# Patient Record
Sex: Male | Born: 1960 | Race: White | Hispanic: No | Marital: Married | Smoking: Never smoker
Health system: Southern US, Community
[De-identification: ages and names within clinical notes are randomized; demographics above are authoritative.]

## PROBLEM LIST (undated history)

## (undated) DIAGNOSIS — E119 Type 2 diabetes mellitus without complications: Secondary | ICD-10-CM

## (undated) DIAGNOSIS — J189 Pneumonia, unspecified organism: Secondary | ICD-10-CM

## (undated) DIAGNOSIS — T7840XA Allergy, unspecified, initial encounter: Secondary | ICD-10-CM

## (undated) DIAGNOSIS — I4891 Unspecified atrial fibrillation: Secondary | ICD-10-CM

## (undated) DIAGNOSIS — M5126 Other intervertebral disc displacement, lumbar region: Secondary | ICD-10-CM

## (undated) DIAGNOSIS — G4733 Obstructive sleep apnea (adult) (pediatric): Secondary | ICD-10-CM

## (undated) DIAGNOSIS — J45909 Unspecified asthma, uncomplicated: Secondary | ICD-10-CM

## (undated) DIAGNOSIS — M51369 Other intervertebral disc degeneration, lumbar region without mention of lumbar back pain or lower extremity pain: Secondary | ICD-10-CM

## (undated) DIAGNOSIS — M1990 Unspecified osteoarthritis, unspecified site: Secondary | ICD-10-CM

## (undated) DIAGNOSIS — G709 Myoneural disorder, unspecified: Secondary | ICD-10-CM

## (undated) DIAGNOSIS — G4730 Sleep apnea, unspecified: Secondary | ICD-10-CM

## (undated) DIAGNOSIS — M5136 Other intervertebral disc degeneration, lumbar region: Secondary | ICD-10-CM

## (undated) DIAGNOSIS — K219 Gastro-esophageal reflux disease without esophagitis: Secondary | ICD-10-CM

## (undated) DIAGNOSIS — I10 Essential (primary) hypertension: Secondary | ICD-10-CM

## (undated) HISTORY — DX: Gastro-esophageal reflux disease without esophagitis: K21.9

## (undated) HISTORY — DX: Allergy, unspecified, initial encounter: T78.40XA

## (undated) HISTORY — DX: Other intervertebral disc degeneration, lumbar region without mention of lumbar back pain or lower extremity pain: M51.369

## (undated) HISTORY — PX: KNEE ARTHROPLASTY: SHX992

## (undated) HISTORY — DX: Other intervertebral disc degeneration, lumbar region: M51.36

## (undated) HISTORY — DX: Sleep apnea, unspecified: G47.30

## (undated) HISTORY — DX: Obstructive sleep apnea (adult) (pediatric): G47.33

## (undated) HISTORY — DX: Unspecified atrial fibrillation: I48.91

## (undated) HISTORY — DX: Other intervertebral disc displacement, lumbar region: M51.26

## (undated) HISTORY — DX: Myoneural disorder, unspecified: G70.9

## (undated) HISTORY — PX: NASAL SEPTUM SURGERY: SHX37

## (undated) HISTORY — PX: VASECTOMY: SHX75

## (undated) HISTORY — DX: Unspecified asthma, uncomplicated: J45.909

---

## 1998-01-24 ENCOUNTER — Ambulatory Visit (HOSPITAL_COMMUNITY): Admission: RE | Admit: 1998-01-24 | Discharge: 1998-01-24 | Payer: Self-pay | Admitting: Otolaryngology

## 1998-06-04 ENCOUNTER — Ambulatory Visit (HOSPITAL_BASED_OUTPATIENT_CLINIC_OR_DEPARTMENT_OTHER): Admission: RE | Admit: 1998-06-04 | Discharge: 1998-06-04 | Payer: Self-pay | Admitting: Otolaryngology

## 2002-06-26 ENCOUNTER — Encounter: Payer: Self-pay | Admitting: Orthopedic Surgery

## 2002-06-26 ENCOUNTER — Ambulatory Visit (HOSPITAL_COMMUNITY): Admission: RE | Admit: 2002-06-26 | Discharge: 2002-06-26 | Payer: Self-pay | Admitting: Orthopedic Surgery

## 2004-01-04 ENCOUNTER — Ambulatory Visit (HOSPITAL_COMMUNITY): Admission: RE | Admit: 2004-01-04 | Discharge: 2004-01-04 | Payer: Self-pay | Admitting: Urology

## 2004-07-28 ENCOUNTER — Emergency Department (HOSPITAL_COMMUNITY): Admission: EM | Admit: 2004-07-28 | Discharge: 2004-07-28 | Payer: Self-pay | Admitting: Emergency Medicine

## 2012-06-10 ENCOUNTER — Ambulatory Visit (INDEPENDENT_AMBULATORY_CARE_PROVIDER_SITE_OTHER): Payer: BC Managed Care – PPO | Admitting: Sports Medicine

## 2012-06-10 ENCOUNTER — Encounter: Payer: Self-pay | Admitting: Sports Medicine

## 2012-06-10 VITALS — BP 124/71 | HR 66 | Wt 207.0 lb

## 2012-06-10 DIAGNOSIS — M79609 Pain in unspecified limb: Secondary | ICD-10-CM

## 2012-06-10 DIAGNOSIS — I480 Paroxysmal atrial fibrillation: Secondary | ICD-10-CM | POA: Insufficient documentation

## 2012-06-10 DIAGNOSIS — I4891 Unspecified atrial fibrillation: Secondary | ICD-10-CM

## 2012-06-10 DIAGNOSIS — M79671 Pain in right foot: Secondary | ICD-10-CM

## 2012-06-10 DIAGNOSIS — Z299 Encounter for prophylactic measures, unspecified: Secondary | ICD-10-CM

## 2012-06-10 DIAGNOSIS — Z0000 Encounter for general adult medical examination without abnormal findings: Secondary | ICD-10-CM

## 2012-06-10 DIAGNOSIS — M79672 Pain in left foot: Secondary | ICD-10-CM

## 2012-06-10 DIAGNOSIS — Q6650 Congenital pes planus, unspecified foot: Secondary | ICD-10-CM | POA: Insufficient documentation

## 2012-06-10 MED ORDER — ASPIRIN EC 81 MG PO TBEC: 81.0000 mg | DELAYED_RELEASE_TABLET | Freq: Every day | ORAL | Status: DC

## 2012-06-10 NOTE — Assessment & Plan Note (Signed)
CPE done today. Fasting bloodwork ordered.

## 2012-06-10 NOTE — Assessment & Plan Note (Signed)
Most likely related to gout as he has had flares on the left and right at the first metatarsophalangeal joint. Intra-articular injections have helped, however he does not carry a gout diagnoses. I would like him to come back for custom orthotics. I'm also going to add a uric acid level.

## 2012-06-10 NOTE — Progress Notes (Signed)
Subjective:    CC: Establish care.   HPI:  Foot pain: Bilateral, wanted off, flares always come into the first metatarsophalangeal joint. He has never had uric acid levels tested, and has never had an arthrocentesis. His last flare he went to [employer], and had an intra-articular injection. This resolved his symptoms. He does note that the symptoms are somewhat worse when eating lots of red meat. Pain is localized, doesn't radiate, is severe when he gets it. He would also like some custom orthotics.  Atrial fibrillation:  Occurred in the past after being on phentermine, but 3 months after stopping, recurrent. Has been on beta blockers, but is not on aspirin.  Past medical history, Surgical history, Family history, Social history, Allergies, and medications have been entered into the medical record, reviewed, and no changes needed.   Review of Systems: No headache, visual changes, nausea, vomiting, diarrhea, constipation, dizziness, abdominal pain, skin rash, fevers, chills, night sweats, swollen lymph nodes, weight loss, chest pain, body aches, joint swelling, muscle aches, shortness of breath, mood changes, visual or auditory hallucinations.  Objective:    General: Well Developed, well nourished, and in no acute distress.  Neuro: Alert and oriented x3, extra-ocular muscles intact.  HEENT: Normocephalic, atraumatic, pupils equal round reactive to light, neck supple, no masses, no lymphadenopathy, thyroid nonpalpable.  Skin: Warm and dry, no rashes noted.  Cardiac: Regular rate and rhythm, no murmurs rubs or gallops.  Respiratory: Clear to auscultation bilaterally. Not using accessory muscles, speaking in full sentences.  Abdominal: Soft, nontender, nondistended, positive bowel sounds, no masses, no organomegaly.  Musculoskeletal: Shoulder, elbow, wrist, hip, knee, ankle stable, and with full range of motion. Rectal exam: Negative, prostate smooth, tone normal.  Hemoccult  negative.  Impression and Recommendations:    The patient was counselled, risk factors were discussed, anticipatory guidance given.

## 2012-06-10 NOTE — Assessment & Plan Note (Signed)
With a low CHADS2 score, paroxysmal and related to being on phentermine. He only needs aspirin.

## 2012-06-12 ENCOUNTER — Encounter: Payer: Self-pay | Admitting: Sports Medicine

## 2012-06-12 DIAGNOSIS — E785 Hyperlipidemia, unspecified: Secondary | ICD-10-CM | POA: Insufficient documentation

## 2012-06-12 DIAGNOSIS — E291 Testicular hypofunction: Secondary | ICD-10-CM | POA: Insufficient documentation

## 2012-06-12 DIAGNOSIS — M109 Gout, unspecified: Secondary | ICD-10-CM | POA: Insufficient documentation

## 2012-06-12 LAB — LIPID PANEL
Cholesterol: 195 mg/dL (ref 0–200)
HDL: 41 mg/dL (ref >39)
LDL Cholesterol: 132 mg/dL — ABNORMAL HIGH (ref 0–99)
Total CHOL/HDL Ratio: 4.8 Ratio
Triglycerides: 108 mg/dL (ref <150)
VLDL: 22 mg/dL (ref 0–40)

## 2012-06-12 LAB — CBC
HCT: 41.8 % (ref 39.0–52.0)
Hemoglobin: 14.9 g/dL (ref 13.0–17.0)
MCH: 32 pg (ref 26.0–34.0)
MCHC: 35.6 g/dL (ref 30.0–36.0)
MCV: 89.9 fL (ref 78.0–100.0)
Platelets: 148 10*3/uL — ABNORMAL LOW (ref 150–400)
RBC: 4.65 MIL/uL (ref 4.22–5.81)
RDW: 13.2 % (ref 11.5–15.5)
WBC: 6.1 10*3/uL (ref 4.0–10.5)

## 2012-06-12 LAB — TSH: TSH: 2.191 u[IU]/mL (ref 0.350–4.500)

## 2012-06-12 LAB — COMPREHENSIVE METABOLIC PANEL
ALT: 26 U/L (ref 0–53)
AST: 23 U/L (ref 0–37)
Albumin: 4.3 g/dL (ref 3.5–5.2)
Alkaline Phosphatase: 44 U/L (ref 39–117)
BUN: 20 mg/dL (ref 6–23)
CO2: 31 mEq/L (ref 19–32)
Calcium: 9.2 mg/dL (ref 8.4–10.5)
Chloride: 99 mEq/L (ref 96–112)
Creat: 1.05 mg/dL (ref 0.50–1.35)
Glucose, Bld: 83 mg/dL (ref 70–99)
Potassium: 4 mEq/L (ref 3.5–5.3)
Sodium: 137 mEq/L (ref 135–145)
Total Bilirubin: 1.5 mg/dL — ABNORMAL HIGH (ref 0.3–1.2)
Total Protein: 6.6 g/dL (ref 6.0–8.3)

## 2012-06-12 LAB — URIC ACID: Uric Acid, Serum: 8.2 mg/dL — ABNORMAL HIGH (ref 4.0–7.8)

## 2012-06-12 LAB — VITAMIN D 25 HYDROXY (VIT D DEFICIENCY, FRACTURES): Vit D, 25-Hydroxy: 34 ng/mL (ref 30–89)

## 2012-06-14 LAB — TESTOSTERONE, FREE, TOTAL, SHBG
Sex Hormone Binding: 22 nmol/L (ref 13–71)
Testosterone, Free: 55.1 pg/mL (ref 47.0–244.0)
Testosterone-% Free: 2.4 % (ref 1.6–2.9)
Testosterone: 231.06 ng/dL — ABNORMAL LOW (ref 300–890)

## 2012-06-16 ENCOUNTER — Encounter: Payer: BC Managed Care – PPO | Admitting: Sports Medicine

## 2012-06-28 ENCOUNTER — Encounter: Payer: BC Managed Care – PPO | Admitting: Sports Medicine

## 2012-07-08 ENCOUNTER — Ambulatory Visit (INDEPENDENT_AMBULATORY_CARE_PROVIDER_SITE_OTHER): Payer: BC Managed Care – PPO | Admitting: Sports Medicine

## 2012-07-08 DIAGNOSIS — M79672 Pain in left foot: Secondary | ICD-10-CM

## 2012-07-08 DIAGNOSIS — M79671 Pain in right foot: Secondary | ICD-10-CM

## 2012-07-08 DIAGNOSIS — M109 Gout, unspecified: Secondary | ICD-10-CM

## 2012-07-08 DIAGNOSIS — R269 Unspecified abnormalities of gait and mobility: Secondary | ICD-10-CM

## 2012-07-08 DIAGNOSIS — M79609 Pain in unspecified limb: Secondary | ICD-10-CM

## 2012-07-08 MED ORDER — ALLOPURINOL 300 MG PO TABS: ORAL_TABLET | ORAL | Status: DC

## 2012-07-08 NOTE — Assessment & Plan Note (Signed)
Custom orthotics as above. Return in 4 weeks to recheck.

## 2012-07-08 NOTE — Progress Notes (Signed)
Subjective:    CC: Followup and orthotics.  HPI: Elevated uric acid: Has had multiple flares of podagra as well as knee pain. He's had intra-articular injections, but has never been checked for uric acid levels or gout. I recently checked this, and uric acid levels were very elevated. Currently he is not having a flare.  Foot pain: With bilateral pes planus, and abnormality of gait, with pain localized on both arches, mild, without radiation, is here for custom orthotics.  Past medical history, Surgical history, Family history not pertinant except as noted below, Social history, Allergies, and medications have been entered into the medical record, reviewed, and no changes needed.   Review of Systems: No fevers, chills, night sweats, weight loss, chest pain, or shortness of breath.   Objective:    General: Well Developed, well nourished, and in no acute distress.  Neuro: Alert and oriented x3, extra-ocular muscles intact, sensation grossly intact.  HEENT: Normocephalic, atraumatic, pupils equal round reactive to light, neck supple, no masses, no lymphadenopathy, thyroid nonpalpable.  Skin: Warm and dry, no rashes. Cardiac: Regular rate and rhythm, no murmurs rubs or gallops.  Respiratory: Clear to auscultation bilaterally. Not using accessory muscles, speaking in full sentences.  Patient was fitted for a : standard, cushioned, semi-rigid orthotic. The orthotic was heated and afterward the patient stood on the orthotic blank positioned on the orthotic stand. The patient was positioned in subtalar neutral position and 10 degrees of ankle dorsiflexion in a weight bearing stance. After completion of molding, a stable base was applied to the orthotic blank. The blank was ground to a stable position for weight bearing. Size: 12 Base: Blue EVA Additional Posting and Padding: None The patient ambulated these, and they were very comfortable.  Impression and Recommendations:

## 2012-07-08 NOTE — Assessment & Plan Note (Signed)
Uric acid was high. Allopurinol, recheck in one month.

## 2012-07-24 ENCOUNTER — Other Ambulatory Visit: Payer: Self-pay

## 2012-08-05 ENCOUNTER — Ambulatory Visit (INDEPENDENT_AMBULATORY_CARE_PROVIDER_SITE_OTHER): Payer: BC Managed Care – PPO | Admitting: Sports Medicine

## 2012-08-05 VITALS — BP 124/77 | HR 60 | Wt 203.0 lb

## 2012-08-05 DIAGNOSIS — E291 Testicular hypofunction: Secondary | ICD-10-CM

## 2012-08-05 DIAGNOSIS — M109 Gout, unspecified: Secondary | ICD-10-CM

## 2012-08-05 DIAGNOSIS — E785 Hyperlipidemia, unspecified: Secondary | ICD-10-CM

## 2012-08-05 DIAGNOSIS — Q6650 Congenital pes planus, unspecified foot: Secondary | ICD-10-CM

## 2012-08-05 NOTE — Assessment & Plan Note (Signed)
Rechecking uric acid levels on allopurinol. Goal is 5.

## 2012-08-05 NOTE — Progress Notes (Signed)
Subjective:    CC: Followup  HPI: Hyperlipidemia: Would like to work on diet and exercise.  Gout: No further flares, having no problems with allopurinol.  Bilateral pes planus: Doing very well with custom orthotics. Pain is 100% resolved.  Hypogonadism: Denies any symptoms of fatigue, decreased sex drive, erectile dysfunction, or difficulty gaining the muscle mass.  Past medical history, Surgical history, Family history not pertinant except as noted below, Social history, Allergies, and medications have been entered into the medical record, reviewed, and no changes needed.   Review of Systems: No fevers, chills, night sweats, weight loss, chest pain, or shortness of breath.   Objective:    General: Well Developed, well nourished, and in no acute distress.  Neuro: Alert and oriented x3, extra-ocular muscles intact, sensation grossly intact.  HEENT: Normocephalic, atraumatic, pupils equal round reactive to light, neck supple, no masses, no lymphadenopathy, thyroid nonpalpable.  Skin: Warm and dry, no rashes. Cardiac: Regular rate and rhythm, no murmurs rubs or gallops.  Respiratory: Clear to auscultation bilaterally. Not using accessory muscles, speaking in full sentences. Impression and Recommendations:

## 2012-08-05 NOTE — Assessment & Plan Note (Signed)
Resolved with custom orthotics 

## 2012-08-05 NOTE — Assessment & Plan Note (Signed)
We will do a trial of diet and exercise and lifestyle modification. If no better in 6-12 weeks we may need to add Lipitor.

## 2012-08-05 NOTE — Patient Instructions (Addendum)

## 2012-08-05 NOTE — Assessment & Plan Note (Signed)
No symptoms, so will not treat.

## 2012-08-06 ENCOUNTER — Other Ambulatory Visit: Payer: Self-pay | Admitting: *Deleted

## 2012-08-06 LAB — URIC ACID: Uric Acid, Serum: 7.2 mg/dL (ref 4.0–7.8)

## 2012-08-06 MED ORDER — METOPROLOL SUCCINATE ER 25 MG PO TB24: 25.0000 mg | ORAL_TABLET | Freq: Every day | ORAL | Status: DC

## 2012-08-06 MED ORDER — ALLOPURINOL 300 MG PO TABS: 300.0000 mg | ORAL_TABLET | Freq: Two times a day (BID) | ORAL | Status: DC

## 2012-08-06 NOTE — Addendum Note (Signed)
Addended by: Monica Becton on: 08/06/2012 09:31 AM   Modules accepted: Orders

## 2012-09-30 ENCOUNTER — Ambulatory Visit: Payer: BC Managed Care – PPO | Admitting: Sports Medicine

## 2012-12-21 ENCOUNTER — Ambulatory Visit (INDEPENDENT_AMBULATORY_CARE_PROVIDER_SITE_OTHER): Payer: BC Managed Care – PPO | Admitting: Sports Medicine

## 2012-12-21 ENCOUNTER — Encounter: Payer: Self-pay | Admitting: Sports Medicine

## 2012-12-21 VITALS — BP 137/87 | HR 81 | Wt 214.0 lb

## 2012-12-21 DIAGNOSIS — M109 Gout, unspecified: Secondary | ICD-10-CM

## 2012-12-21 MED ORDER — ALLOPURINOL 300 MG PO TABS: 300.0000 mg | ORAL_TABLET | Freq: Two times a day (BID) | ORAL | Status: DC

## 2012-12-21 MED ORDER — DICLOFENAC SODIUM 75 MG PO TBEC: 75.0000 mg | DELAYED_RELEASE_TABLET | Freq: Two times a day (BID) | ORAL | Status: DC

## 2012-12-21 NOTE — Progress Notes (Signed)
  Subjective:    CC: Right great toe pain  HPI: This is a pleasant 52 year old male with a history of gout who comes in with a one-day history of pain and swelling in his right first metatarsophalangeal joint. Pain is localized, does not radiate , moderate to severe.  He is on allopurinol, most recent uric acid levels were in the sevens.  Past medical history, Surgical history, Family history not pertinant except as noted below, Social history, Allergies, and medications have been entered into the medical record, reviewed, and no changes needed.   Review of Systems: No fevers, chills, night sweats, weight loss, chest pain, or shortness of breath.   Objective:    General: Well Developed, well nourished, and in no acute distress.  Neuro: Alert and oriented x3, extra-ocular muscles intact, sensation grossly intact.  HEENT: Normocephalic, atraumatic, pupils equal round reactive to light, neck supple, no masses, no lymphadenopathy, thyroid nonpalpable.  Skin: Warm and dry, no rashes. Cardiac: Regular rate and rhythm, no murmurs rubs or gallops, no lower extremity edema.  Respiratory: Clear to auscultation bilaterally. Not using accessory muscles, speaking in full sentences. Right Foot: Fusiform swelling over the first metatarsophalangeal joint Range of motion is full in all directions. Strength is 5/5 in all directions. No hallux valgus. No pes cavus or pes planus. No abnormal callus noted. No pain over the navicular prominence, or base of fifth metatarsal. No tenderness to palpation of the calcaneal insertion of plantar fascia. No pain at the Achilles insertion. No pain over the calcaneal bursa. No pain of the retrocalcaneal bursa. No tenderness to palpation over the tarsals, metatarsals, or phalanges. No hallux rigidus or limitus. Moderately tender to palpation over the right first metatarsophalangeal joint. No pain with compression of the metatarsal heads. Neurovascularly intact  distally.  Procedure: Real-time Ultrasound Guided Injection of right first MTP Device: GE Logiq E  Verbal informed consent obtained.  Time-out conducted.  Noted no overlying erythema, induration, or other signs of local infection.  Skin prepped in a sterile fashion.  Local anesthesia: Topical Ethyl chloride.  With sterile technique and under real time ultrasound guidance:  25-gauge needle advanced in the joint, 0.5 cc Kenalog 40, 0.5 cc lidocaine injected easily into the joint. Completed without difficulty  Advised to call if fevers/chills, erythema, induration, drainage, or persistent bleeding.  Images permanently stored and available for review in the ultrasound unit.  Impression: Technically successful ultrasound guided injection.  Impression and Recommendations:

## 2012-12-21 NOTE — Assessment & Plan Note (Signed)
Right foot podagra. Guided injection as above. Rechecking uric acid levels in 3 weeks. Refilling Voltaren.

## 2013-01-12 LAB — URIC ACID: Uric Acid, Serum: 3.5 mg/dL — ABNORMAL LOW (ref 4.0–7.8)

## 2013-02-01 ENCOUNTER — Ambulatory Visit (INDEPENDENT_AMBULATORY_CARE_PROVIDER_SITE_OTHER): Payer: BC Managed Care – PPO | Admitting: Sports Medicine

## 2013-02-01 ENCOUNTER — Encounter: Payer: Self-pay | Admitting: Sports Medicine

## 2013-02-01 VITALS — BP 131/84 | HR 80 | Wt 211.0 lb

## 2013-02-01 DIAGNOSIS — M109 Gout, unspecified: Secondary | ICD-10-CM

## 2013-02-01 NOTE — Assessment & Plan Note (Signed)
Pain and first metatarsophalangeal joint has resolved after injection. Uric acid levels are nice and low. Return on an as-needed basis.

## 2013-02-01 NOTE — Progress Notes (Signed)
  Subjective:    CC: Followup  HPI: Podagra: Injected a month ago, pain-free. Uric acid levels were 3.  Past medical history, Surgical history, Family history not pertinant except as noted below, Social history, Allergies, and medications have been entered into the medical record, reviewed, and no changes needed.   Review of Systems: No fevers, chills, night sweats, weight loss, chest pain, or shortness of breath.   Objective:    General: Well Developed, well nourished, and in no acute distress.  Neuro: Alert and oriented x3, extra-ocular muscles intact, sensation grossly intact.  HEENT: Normocephalic, atraumatic, pupils equal round reactive to light, neck supple, no masses, no lymphadenopathy, thyroid nonpalpable.  Skin: Warm and dry, no rashes. Cardiac: Regular rate and rhythm, no murmurs rubs or gallops, no lower extremity edema.  Respiratory: Clear to auscultation bilaterally. Not using accessory muscles, speaking in full sentences. Left Foot: No visible erythema or swelling. Range of motion is full in all directions. Strength is 5/5 in all directions. No hallux valgus. No pes cavus or pes planus. No abnormal callus noted. No pain over the navicular prominence, or base of fifth metatarsal. No tenderness to palpation of the calcaneal insertion of plantar fascia. No pain at the Achilles insertion. No pain over the calcaneal bursa. No pain of the retrocalcaneal bursa. No tenderness to palpation over the tarsals, metatarsals, or phalanges. No hallux rigidus or limitus. No tenderness palpation over interphalangeal joints. No pain with compression of the metatarsal heads. Neurovascularly intact distally.  Impression and Recommendations:

## 2013-04-14 ENCOUNTER — Other Ambulatory Visit: Payer: Self-pay

## 2013-05-04 ENCOUNTER — Other Ambulatory Visit: Payer: Self-pay | Admitting: *Deleted

## 2013-05-04 DIAGNOSIS — M109 Gout, unspecified: Secondary | ICD-10-CM

## 2013-05-04 MED ORDER — ALLOPURINOL 300 MG PO TABS: 300.0000 mg | ORAL_TABLET | Freq: Two times a day (BID) | ORAL | Status: DC

## 2013-05-10 ENCOUNTER — Other Ambulatory Visit: Payer: Self-pay | Admitting: *Deleted

## 2013-05-10 ENCOUNTER — Other Ambulatory Visit: Payer: Self-pay | Admitting: Sports Medicine

## 2013-05-10 DIAGNOSIS — M109 Gout, unspecified: Secondary | ICD-10-CM

## 2013-05-10 MED ORDER — ALLOPURINOL 300 MG PO TABS: 300.0000 mg | ORAL_TABLET | Freq: Two times a day (BID) | ORAL | Status: DC

## 2013-05-16 ENCOUNTER — Encounter: Payer: Self-pay | Admitting: Sports Medicine

## 2013-05-16 ENCOUNTER — Ambulatory Visit (INDEPENDENT_AMBULATORY_CARE_PROVIDER_SITE_OTHER): Payer: BC Managed Care – PPO | Admitting: Sports Medicine

## 2013-05-16 VITALS — BP 124/83 | HR 65 | Wt 219.0 lb

## 2013-05-16 DIAGNOSIS — J0100 Acute maxillary sinusitis, unspecified: Secondary | ICD-10-CM

## 2013-05-16 MED ORDER — PREDNISONE 50 MG PO TABS: 50.0000 mg | ORAL_TABLET | Freq: Every day | ORAL | Status: DC

## 2013-05-16 MED ORDER — BENZONATATE 200 MG PO CAPS: 200.0000 mg | ORAL_CAPSULE | Freq: Three times a day (TID) | ORAL | Status: DC | PRN

## 2013-05-16 MED ORDER — AZITHROMYCIN 250 MG PO TABS: ORAL_TABLET | ORAL | Status: DC

## 2013-05-16 NOTE — Assessment & Plan Note (Signed)
With associated bronchitis and laryngitis. Azithromycin, prednisone, Tessalon Perles for cough, return as needed.

## 2013-05-16 NOTE — Progress Notes (Signed)
  Subjective:    CC: Sick  HPI: This pleasant 52 year old male comes in with sinus pain and pressure, cough that is nonproductive, no GI symptoms. He does have nasal discharge, no constitutional symptoms. Symptoms are moderate, persistent. He also has hoarse voice.  Past medical history, Surgical history, Family history not pertinant except as noted below, Social history, Allergies, and medications have been entered into the medical record, reviewed, and no changes needed.   Review of Systems: No fevers, chills, night sweats, weight loss, chest pain, or shortness of breath.   Objective:    General: Well Developed, well nourished, and in no acute distress.  Neuro: Alert and oriented x3, extra-ocular muscles intact, sensation grossly intact.  HEENT: Normocephalic, atraumatic, pupils equal round reactive to light, neck supple, no masses, no lymphadenopathy, thyroid nonpalpable. Tender to palpation over the maxillary sinuses, external ear canals, oropharynx, nasopharynx unremarkable. Skin: Warm and dry, no rashes. Cardiac: Regular rate and rhythm, no murmurs rubs or gallops, no lower extremity edema.  Respiratory: Clear to auscultation bilaterally. Not using accessory muscles, speaking in full sentences.  Impression and Recommendations:

## 2013-07-29 ENCOUNTER — Encounter: Payer: Self-pay | Admitting: Sports Medicine

## 2013-07-29 ENCOUNTER — Ambulatory Visit (INDEPENDENT_AMBULATORY_CARE_PROVIDER_SITE_OTHER): Payer: BC Managed Care – PPO | Admitting: Sports Medicine

## 2013-07-29 VITALS — BP 149/93 | HR 79 | Wt 220.0 lb

## 2013-07-29 DIAGNOSIS — M7710 Lateral epicondylitis, unspecified elbow: Secondary | ICD-10-CM

## 2013-07-29 DIAGNOSIS — M7712 Lateral epicondylitis, left elbow: Secondary | ICD-10-CM | POA: Insufficient documentation

## 2013-07-29 MED ORDER — HYDROCODONE-ACETAMINOPHEN 5-325 MG PO TABS: 1.0000 | ORAL_TABLET | Freq: Three times a day (TID) | ORAL | Status: DC | PRN

## 2013-07-29 NOTE — Progress Notes (Signed)
  Subjective:    CC: Left elbow pain  HPI: This pleasant 53 year old male comes in with a several week history of pain he localizes over the common extensor tendon origin over his left elbow, he has been using NSAIDs but unfortunately the pain is persistent. No radiation. Moderate, persistent  Past medical history, Surgical history, Family history not pertinant except as noted below, Social history, Allergies, and medications have been entered into the medical record, reviewed, and no changes needed.   Review of Systems: No fevers, chills, night sweats, weight loss, chest pain, or shortness of breath.   Objective:    General: Well Developed, well nourished, and in no acute distress.  Neuro: Alert and oriented x3, extra-ocular muscles intact, sensation grossly intact.  HEENT: Normocephalic, atraumatic, pupils equal round reactive to light, neck supple, no masses, no lymphadenopathy, thyroid nonpalpable.  Skin: Warm and dry, no rashes. Cardiac: Regular rate and rhythm, no murmurs rubs or gallops, no lower extremity edema.  Respiratory: Clear to auscultation bilaterally. Not using accessory muscles, speaking in full sentences. Left Elbow: Unremarkable to inspection. Range of motion full pronation, supination, flexion, extension. Strength is full to all of the above directions Stable to varus, valgus stress. Negative moving valgus stress test. Tender to palpation of the common extensor tendon origin with reproduction of pain with resisted extension of the middle finger. Negative cubital tunnel Tinel's.  Procedure: Real-time Ultrasound Guided Injection of left common extensor tendon origin Device: GE Logiq E  Verbal informed consent obtained.  Time-out conducted.  Noted no overlying erythema, induration, or other signs of local infection.  Skin prepped in a sterile fashion.  Local anesthesia: Topical Ethyl chloride.  With sterile technique and under real time ultrasound guidance:  I  performed a needle tenotomy injecting a total of 1 cc Kenalog 40, and 3 cc lidocaine just deep to and superficial to the common extensor tendon origin. Completed without difficulty  Pain immediately resolved suggesting accurate placement of the medication.  Advised to call if fevers/chills, erythema, induration, drainage, or persistent bleeding.  Images permanently stored and available for review in the ultrasound unit.  Impression: Technically successful ultrasound guided injection.  The elbow was then strapped with compressive dressing.  Impression and Recommendations:

## 2013-07-29 NOTE — Assessment & Plan Note (Signed)
Ultrasound guided injection/needle tenotomy. Vicodin for postprocedural pain. Strap with compressive dressing. Counter force brace, home rehabilitation. Limited use for the next week at his left arm. Return to see me in one month.

## 2013-08-29 ENCOUNTER — Ambulatory Visit: Payer: BC Managed Care – PPO | Admitting: Sports Medicine

## 2013-09-23 ENCOUNTER — Other Ambulatory Visit: Payer: Self-pay | Admitting: Sports Medicine

## 2013-10-13 ENCOUNTER — Ambulatory Visit (INDEPENDENT_AMBULATORY_CARE_PROVIDER_SITE_OTHER): Payer: BC Managed Care – PPO | Admitting: Sports Medicine

## 2013-10-13 ENCOUNTER — Encounter: Payer: Self-pay | Admitting: Sports Medicine

## 2013-10-13 VITALS — BP 123/82 | HR 72 | Ht 70.0 in | Wt 218.0 lb

## 2013-10-13 DIAGNOSIS — M7712 Lateral epicondylitis, left elbow: Secondary | ICD-10-CM

## 2013-10-13 DIAGNOSIS — M7710 Lateral epicondylitis, unspecified elbow: Secondary | ICD-10-CM

## 2013-10-13 MED ORDER — DICLOFENAC SODIUM 75 MG PO TBEC: 75.0000 mg | DELAYED_RELEASE_TABLET | Freq: Two times a day (BID) | ORAL | Status: DC

## 2013-10-13 MED ORDER — NITROGLYCERIN 0.2 MG/HR TD PT24: MEDICATED_PATCH | TRANSDERMAL | Status: DC

## 2013-10-13 NOTE — Assessment & Plan Note (Signed)
Overall improved significantly after ultrasound-guided needle tenotomy in February. He did have increasing pain after fishing, this is now improving. I think at this point he can see a physical therapist and continue home rehabilitation, also like to add topical nitroglycerin patches. Return in a month. MRI if no better.

## 2013-10-13 NOTE — Progress Notes (Signed)
  Subjective:    CC: Recheck elbow  HPI: This is a very pleasant 53 year old male, in February I did an ultrasound-guided percutaneous needle tenotomy of the left common extensor tendon. He had a fantastic response until he went fishing a month ago. As he tried to reel in a large fish, he felt a tearing sensation in his common extensor tendon origin. Since then it improved significantly, pain is only mild. He continues to do some of the rehabilitation exercises. He does need a refill on diclofenac.  Past medical history, Surgical history, Family history not pertinant except as noted below, Social history, Allergies, and medications have been entered into the medical record, reviewed, and no changes needed.   Review of Systems: No fevers, chills, night sweats, weight loss, chest pain, or shortness of breath.   Objective:    General: Well Developed, well nourished, and in no acute distress.  Neuro: Alert and oriented x3, extra-ocular muscles intact, sensation grossly intact.  HEENT: Normocephalic, atraumatic, pupils equal round reactive to light, neck supple, no masses, no lymphadenopathy, thyroid nonpalpable.  Skin: Warm and dry, no rashes. Cardiac: Regular rate and rhythm, no murmurs rubs or gallops, no lower extremity edema.  Respiratory: Clear to auscultation bilaterally. Not using accessory muscles, speaking in full sentences. Left Elbow: Unremarkable to inspection. Range of motion full pronation, supination, flexion, extension. Strength is full to all of the above directions, there is only mild reproduction of pain with resisted wrist extension, and middle finger extension. Stable to varus, valgus stress. Negative moving valgus stress test. No discrete areas of tenderness to palpation. Ulnar nerve does not sublux. Negative cubital tunnel Tinel's.  Impression and Recommendations:

## 2013-11-16 ENCOUNTER — Other Ambulatory Visit: Payer: Self-pay | Admitting: Sports Medicine

## 2013-12-20 ENCOUNTER — Other Ambulatory Visit: Payer: Self-pay | Admitting: Sports Medicine

## 2014-01-24 ENCOUNTER — Other Ambulatory Visit: Payer: Self-pay | Admitting: Sports Medicine

## 2014-02-24 ENCOUNTER — Ambulatory Visit: Payer: BC Managed Care – PPO | Admitting: Physician Assistant

## 2014-02-27 ENCOUNTER — Telehealth: Payer: Self-pay | Admitting: *Deleted

## 2014-02-27 ENCOUNTER — Ambulatory Visit (INDEPENDENT_AMBULATORY_CARE_PROVIDER_SITE_OTHER): Payer: BC Managed Care – PPO

## 2014-02-27 ENCOUNTER — Other Ambulatory Visit: Payer: Self-pay | Admitting: Sports Medicine

## 2014-02-27 ENCOUNTER — Encounter: Payer: Self-pay | Admitting: Sports Medicine

## 2014-02-27 ENCOUNTER — Ambulatory Visit (INDEPENDENT_AMBULATORY_CARE_PROVIDER_SITE_OTHER): Payer: BC Managed Care – PPO | Admitting: Sports Medicine

## 2014-02-27 VITALS — BP 120/76 | HR 78 | Ht 70.0 in | Wt 222.0 lb

## 2014-02-27 DIAGNOSIS — Z0189 Encounter for other specified special examinations: Secondary | ICD-10-CM

## 2014-02-27 DIAGNOSIS — M25469 Effusion, unspecified knee: Secondary | ICD-10-CM

## 2014-02-27 DIAGNOSIS — M25461 Effusion, right knee: Secondary | ICD-10-CM

## 2014-02-27 DIAGNOSIS — M23302 Other meniscus derangements, unspecified lateral meniscus, unspecified knee: Secondary | ICD-10-CM

## 2014-02-27 DIAGNOSIS — M1711 Unilateral primary osteoarthritis, right knee: Secondary | ICD-10-CM | POA: Insufficient documentation

## 2014-02-27 NOTE — Telephone Encounter (Signed)
No prior auth required. William Ortega, CMA 

## 2014-02-27 NOTE — Progress Notes (Deleted)

## 2014-02-27 NOTE — Assessment & Plan Note (Signed)
History of gout but with significant hemarthrosis on aspirate. Injected, fluid analysis. MRI. Return to go for MRI results.

## 2014-02-27 NOTE — Progress Notes (Signed)
Patient ID: William Ortega, male   DOB: 1961-04-23, 53 y.o.   MRN: [id number]  Subjective:    CC: Right knee pain and swelling  HPI: William Ortega is a very pleasant 53 year old man with history of gout who presents with 5 days of right knee pain and swelling. States that the discomfort began on Wednesday (9/16), but rapidly progressed to a severe pain with significant swelling on Friday (9/18). He describes the pain as stabbing and deep within the joint, worse with walking. Over the past three days, he has utilized a knee brace and diclofenac 75 mg bid. With use of these interventions, his swelling and pain have decreased significantly. In the past, his gout has been limited to the first metatarsophalangeal joints and has been treated with daily allopurinol, which he reports taking consistently. No history of trauma to the knee.  Past medical history, Surgical history, Family history not pertinant except as noted below, Social history, Allergies, and medications have been entered into the medical record, reviewed, and no changes needed.   Review of Systems: No fevers, chills, or muscle aches.   Objective:    General: Well developed, well nourished, and in no acute distress.  Neuro: Alert and oriented x3, extra-ocular muscles intact, sensation grossly intact.  HEENT: Normocephalic, atraumatic, pupils equal round reactive to light, neck supple, no masses, no lymphadenopathy, thyroid nonpalpable.  Skin: Warm and dry, no rashes. Cardiac: Regular rate and rhythm, no murmurs rubs or gallops, no lower extremity edema.  Respiratory: Clear to auscultation bilaterally. Not using accessory muscles, speaking in full sentences.  Right Knee: Normal to inspection with no erythema or obvious bony abnormalities. Effusion present and knee is warm to the touch. Palpation normal with no joint line tenderness, patellar tenderness, or condyle tenderness. ROM full in flexion and extension and lower leg  rotation. Ligaments with solid consistent endpoints including ACL, PCL, LCL, MCL. Negative Mcmurray's, Apley's, and Thessalonian tests. Non painful patellar compression. Patellar glide without crepitus. Patellar and quadriceps tendons unremarkable. Hamstring and quadriceps strength is normal.   Procedure: Real-time Ultrasound Guided aspiration/Injection of right knee Device: GE Logiq E  Verbal informed consent obtained.  Time-out conducted.  Noted no overlying erythema, induration, or other signs of local infection.  Skin prepped in a sterile fashion.  Local anesthesia: Topical Ethyl chloride.  With sterile technique and under real time ultrasound guidance:  30 cc of frank blood aspirated, syringe switched and 2 cc kenalog 40, 4 cc lidocaine injected. Completed without difficulty  Pain immediately resolved suggesting accurate placement of the medication.  Advised to call if fevers/chills, erythema, induration, drainage, or persistent bleeding.  Images permanently stored and available for review in the ultrasound unit.  Impression: Technically successful ultrasound guided injection. Impression and Recommendations:   Right Knee Pain: Acute gout is the most likely diagnosis in this patient with a history of gout and rapid-onset pain and swelling. This is further supported by the warmth and effusion that is present on exam. Ultrasound-guided injection of the right knee was done in the office today, and there was significant hemarthrosis on the aspirate. This suggests that there may be another underlying issue, so MRI is appropriate at this time. - MRI - Fluid analysis of aspirate - Return to go over MRI results.

## 2014-02-28 LAB — SYNOVIAL CELL COUNT + DIFF, W/ CRYSTALS
Crystals, Fluid: NONE SEEN
Eosinophils-Synovial: 0 % (ref 0–1)
Lymphocytes-Synovial Fld: 31 % — ABNORMAL HIGH (ref 0–20)
Monocyte/Macrophage: 4 % — ABNORMAL LOW (ref 50–90)
Neutrophil, Synovial: 65 % — ABNORMAL HIGH (ref 0–25)

## 2014-03-02 ENCOUNTER — Ambulatory Visit: Payer: BC Managed Care – PPO | Admitting: Sports Medicine

## 2014-03-03 LAB — BODY FLUID CULTURE
Gram Stain: NONE SEEN
Organism ID, Bacteria: NO GROWTH

## 2014-03-09 ENCOUNTER — Ambulatory Visit (INDEPENDENT_AMBULATORY_CARE_PROVIDER_SITE_OTHER): Payer: BC Managed Care – PPO | Admitting: Sports Medicine

## 2014-03-09 ENCOUNTER — Encounter: Payer: Self-pay | Admitting: Sports Medicine

## 2014-03-09 ENCOUNTER — Ambulatory Visit: Payer: BC Managed Care – PPO | Admitting: Sports Medicine

## 2014-03-09 VITALS — BP 127/79 | HR 64 | Ht 70.0 in | Wt 226.0 lb

## 2014-03-09 DIAGNOSIS — M1711 Unilateral primary osteoarthritis, right knee: Secondary | ICD-10-CM | POA: Diagnosis not present

## 2014-03-09 NOTE — Assessment & Plan Note (Signed)
MRI showed mostly arthritis. Aspiration of hemarthrosis, injection with steroid and OrthoVisc today. Physical therapy. Return in one week for OrthoVisc injection #2.

## 2014-03-09 NOTE — Progress Notes (Signed)
  Subjective:    CC: Right knee pain  HPI: This is a very pleasant 53 year old male, at the last visit we aspirated frank blood from his knee injected. A subsequent MRI showed simply degenerative changes without any acute meniscal tears. He is overall doing well but continues to have somewhat of an effusion with very mild pain particularly when the barometric pressure is low. Pain is worse to the joint lines, mild, persistent.  Past medical history, Surgical history, Family history not pertinant except as noted below, Social history, Allergies, and medications have been entered into the medical record, reviewed, and no changes needed.   Review of Systems: No fevers, chills, night sweats, weight loss, chest pain, or shortness of breath.   Objective:    General: Well Developed, well nourished, and in no acute distress.  Neuro: Alert and oriented x3, extra-ocular muscles intact, sensation grossly intact.  HEENT: Normocephalic, atraumatic, pupils equal round reactive to light, neck supple, no masses, no lymphadenopathy, thyroid nonpalpable.  Skin: Warm and dry, no rashes. Cardiac: Regular rate and rhythm, no murmurs rubs or gallops, no lower extremity edema.  Respiratory: Clear to auscultation bilaterally. Not using accessory muscles, speaking in full sentences. Right Knee: Extremely mild visible and palpable effusion. ROM normal in flexion and extension and lower leg rotation. Ligaments with solid consistent endpoints including ACL, PCL, LCL, MCL. Negative Mcmurray's and provocative meniscal tests. Non painful patellar compression. Patellar and quadriceps tendons unremarkable. Hamstring and quadriceps strength is normal.  Procedure: Real-time Ultrasound Guided aspiration/Injection of right knee Device: GE Logiq E  Verbal informed consent obtained.  Time-out conducted.  Noted no overlying erythema, induration, or other signs of local infection.  Skin prepped in a sterile fashion.  Local  anesthesia: Topical Ethyl chloride.  With sterile technique and under real time ultrasound guidance:  Using an 18-gauge needle, 30 cc of frank blood was aspirated, syringe switched and 2 cc kenalog 40, 4 cc lidocaine injected easily, syringe again switched and 30 mg/2 mL of OrthoVisc (sodium hyaluronate) in a prefilled syringe was injected easily into the knee through a 22-gauge needle. Completed without difficulty  Pain immediately resolved suggesting accurate placement of the medication.  Advised to call if fevers/chills, erythema, induration, drainage, or persistent bleeding.  Images permanently stored and available for review in the ultrasound unit.  Impression: Technically successful ultrasound guided injection.  Impression and Recommendations:

## 2014-03-20 ENCOUNTER — Ambulatory Visit (INDEPENDENT_AMBULATORY_CARE_PROVIDER_SITE_OTHER): Payer: BC Managed Care – PPO | Admitting: Sports Medicine

## 2014-03-20 ENCOUNTER — Encounter: Payer: Self-pay | Admitting: Sports Medicine

## 2014-03-20 VITALS — BP 139/82 | HR 89 | Ht 70.0 in | Wt 222.0 lb

## 2014-03-20 DIAGNOSIS — M1711 Unilateral primary osteoarthritis, right knee: Secondary | ICD-10-CM | POA: Diagnosis not present

## 2014-03-20 NOTE — Progress Notes (Signed)
  Procedure: Real-time Ultrasound Guided aspiration/Injection of right knee Device: GE Logiq E  Verbal informed consent obtained.  Time-out conducted.  Noted no overlying erythema, induration, or other signs of local infection.  Skin prepped in a sterile fashion.  Local anesthesia: Topical Ethyl chloride.  With sterile technique and under real time ultrasound guidance:  Scant amount of bloody fluid aspirated, syringe switched and 30 mg/2 mL of OrthoVisc (sodium hyaluronate) in a prefilled syringe was injected easily into the knee through a 22-gauge needle. Completed without difficulty  Pain immediately resolved suggesting accurate placement of the medication.  Advised to call if fevers/chills, erythema, induration, drainage, or persistent bleeding.  Images permanently stored and available for review in the ultrasound unit.  Impression: Technically successful ultrasound guided injection.

## 2014-03-20 NOTE — Assessment & Plan Note (Signed)
OrthoVisc injection #2 into the right knee, return one week for #3, starting to feel better.

## 2014-03-21 ENCOUNTER — Other Ambulatory Visit: Payer: Self-pay | Admitting: Sports Medicine

## 2014-03-27 ENCOUNTER — Ambulatory Visit: Payer: BC Managed Care – PPO | Admitting: Sports Medicine

## 2014-03-28 ENCOUNTER — Encounter: Payer: Self-pay | Admitting: Sports Medicine

## 2014-03-28 ENCOUNTER — Ambulatory Visit (INDEPENDENT_AMBULATORY_CARE_PROVIDER_SITE_OTHER): Payer: BC Managed Care – PPO | Admitting: Sports Medicine

## 2014-03-28 VITALS — BP 126/77 | HR 79 | Ht 70.0 in | Wt 226.0 lb

## 2014-03-28 DIAGNOSIS — M1711 Unilateral primary osteoarthritis, right knee: Secondary | ICD-10-CM

## 2014-03-28 NOTE — Assessment & Plan Note (Signed)
Orthovisc injection #3 of 4 into the right knee, currently pain free. Return in one week for #4.

## 2014-03-28 NOTE — Progress Notes (Signed)
  Procedure: Real-time Ultrasound Guided aspiration/Injection of right knee Device: GE Logiq E  Verbal informed consent obtained.  Time-out conducted.  Noted no overlying erythema, induration, or other signs of local infection.  Skin prepped in a sterile fashion.  Local anesthesia: Topical Ethyl chloride.  With sterile technique and under real time ultrasound guidance:  30 mg/2 mL of OrthoVisc (sodium hyaluronate) in a prefilled syringe was injected easily into the knee through a 22-gauge needle. Completed without difficulty  Pain immediately resolved suggesting accurate placement of the medication.  Advised to call if fevers/chills, erythema, induration, drainage, or persistent bleeding.  Images permanently stored and available for review in the ultrasound unit.  Impression: Technically successful ultrasound guided injection.

## 2014-04-06 ENCOUNTER — Ambulatory Visit: Payer: BC Managed Care – PPO | Admitting: Sports Medicine

## 2014-04-07 ENCOUNTER — Encounter: Payer: Self-pay | Admitting: Sports Medicine

## 2014-04-07 ENCOUNTER — Ambulatory Visit (INDEPENDENT_AMBULATORY_CARE_PROVIDER_SITE_OTHER): Payer: BC Managed Care – PPO | Admitting: Sports Medicine

## 2014-04-07 VITALS — BP 129/85 | HR 71 | Ht 70.0 in | Wt 229.0 lb

## 2014-04-07 DIAGNOSIS — M1711 Unilateral primary osteoarthritis, right knee: Secondary | ICD-10-CM

## 2014-04-07 NOTE — Progress Notes (Signed)
  Procedure: Real-time Ultrasound Guided aspiration/Injection of right knee Device: GE Logiq E  Verbal informed consent obtained.  Time-out conducted.  Noted no overlying erythema, induration, or other signs of local infection.  Skin prepped in a sterile fashion.  Local anesthesia: Topical Ethyl chloride.  With sterile technique and under real time ultrasound guidance:  30 mg/2 mL of OrthoVisc (sodium hyaluronate) in a prefilled syringe was injected easily into the knee through a 22-gauge needle. Completed without difficulty  Pain immediately resolved suggesting accurate placement of the medication.  Advised to call if fevers/chills, erythema, induration, drainage, or persistent bleeding.  Images permanently stored and available for review in the ultrasound unit.  Impression: Technically successful ultrasound guided injection. 

## 2014-04-07 NOTE — Assessment & Plan Note (Signed)
Injection #4 of 4 into the right knee. Still has a little bit of pain, he will do one half of the diclofenac pill twice a day for the next month and then return. At that point we will decide whether or not to send him for arthroscopy.

## 2014-04-21 ENCOUNTER — Ambulatory Visit: Payer: BC Managed Care – PPO | Admitting: Sports Medicine

## 2014-05-08 ENCOUNTER — Ambulatory Visit (INDEPENDENT_AMBULATORY_CARE_PROVIDER_SITE_OTHER): Payer: BC Managed Care – PPO | Admitting: Sports Medicine

## 2014-05-08 DIAGNOSIS — M1711 Unilateral primary osteoarthritis, right knee: Secondary | ICD-10-CM

## 2014-05-08 DIAGNOSIS — M722 Plantar fascial fibromatosis: Secondary | ICD-10-CM | POA: Diagnosis not present

## 2014-05-08 NOTE — Assessment & Plan Note (Signed)
Right knee is pain-free after viscous supplementation.

## 2014-05-08 NOTE — Progress Notes (Signed)
  Subjective:    CC: Follow-up  HPI: Knee osteoarthritis: Resolved after viscous supplementation.  Left heel pain: Moderate, persistent, worse in the mornings, pain is localized at the calcaneal insertion of the plantar fascia, moderate, persistent.  Past medical history, Surgical history, Family history not pertinant except as noted below, Social history, Allergies, and medications have been entered into the medical record, reviewed, and no changes needed.   Review of Systems: No fevers, chills, night sweats, weight loss, chest pain, or shortness of breath.   Objective:    General: Well Developed, well nourished, and in no acute distress.  Neuro: Alert and oriented x3, extra-ocular muscles intact, sensation grossly intact.  HEENT: Normocephalic, atraumatic, pupils equal round reactive to light, neck supple, no masses, no lymphadenopathy, thyroid nonpalpable.  Skin: Warm and dry, no rashes. Cardiac: Regular rate and rhythm, no murmurs rubs or gallops, no lower extremity edema.  Respiratory: Clear to auscultation bilaterally. Not using accessory muscles, speaking in full sentences. Left Foot: No visible erythema or swelling. Range of motion is full in all directions. Strength is 5/5 in all directions. No hallux valgus. No pes cavus or pes planus. No abnormal callus noted. No pain over the navicular prominence, or base of fifth metatarsal. Tender palpation of the calcaneal insertion of plantar fascia. No pain at the Achilles insertion. No pain over the calcaneal bursa. No pain of the retrocalcaneal bursa. No tenderness to palpation over the tarsals, metatarsals, or phalanges. No hallux rigidus or limitus. No tenderness palpation over interphalangeal joints. No pain with compression of the metatarsal heads. Neurovascularly intact distally.  Procedure: Real-time Ultrasound Guided Injection of left plantar fascia Device: GE Logiq E  Verbal informed consent obtained.  Time-out  conducted.  Noted no overlying erythema, induration, or other signs of local infection.  Skin prepped in a sterile fashion.  Local anesthesia: Topical Ethyl chloride.  With sterile technique and under real time ultrasound guidance:  1 mL kenalog 40, 2 mL lidocaine injected easily just deep to the calcaneal insertion of the plantar fascia. Completed without difficulty  Pain immediately resolved suggesting accurate placement of the medication.  Advised to call if fevers/chills, erythema, induration, drainage, or persistent bleeding.  Images permanently stored and available for review in the ultrasound unit.  Impression: Technically successful ultrasound guided injection.  Impression and Recommendations:

## 2014-05-08 NOTE — Assessment & Plan Note (Addendum)
Injection as above. Rehabilitation exercises. Next line return in one month.

## 2014-05-09 ENCOUNTER — Other Ambulatory Visit: Payer: Self-pay | Admitting: Sports Medicine

## 2014-06-06 ENCOUNTER — Encounter (HOSPITAL_BASED_OUTPATIENT_CLINIC_OR_DEPARTMENT_OTHER): Payer: Self-pay | Admitting: *Deleted

## 2014-06-06 ENCOUNTER — Emergency Department (HOSPITAL_BASED_OUTPATIENT_CLINIC_OR_DEPARTMENT_OTHER): Payer: BC Managed Care – PPO

## 2014-06-06 ENCOUNTER — Emergency Department (HOSPITAL_BASED_OUTPATIENT_CLINIC_OR_DEPARTMENT_OTHER)
Admission: EM | Admit: 2014-06-06 | Discharge: 2014-06-06 | Disposition: A | Discharge status: Other Acute Inpatient Hospital | Payer: BC Managed Care – PPO | Attending: Emergency Medicine | Admitting: Emergency Medicine

## 2014-06-06 ENCOUNTER — Other Ambulatory Visit: Payer: Self-pay

## 2014-06-06 DIAGNOSIS — Z7982 Long term (current) use of aspirin: Secondary | ICD-10-CM | POA: Insufficient documentation

## 2014-06-06 DIAGNOSIS — R0600 Dyspnea, unspecified: Secondary | ICD-10-CM

## 2014-06-06 DIAGNOSIS — Z791 Long term (current) use of non-steroidal anti-inflammatories (NSAID): Secondary | ICD-10-CM | POA: Insufficient documentation

## 2014-06-06 DIAGNOSIS — Z79899 Other long term (current) drug therapy: Secondary | ICD-10-CM | POA: Insufficient documentation

## 2014-06-06 DIAGNOSIS — I4891 Unspecified atrial fibrillation: Secondary | ICD-10-CM

## 2014-06-06 DIAGNOSIS — Z880 Allergy status to penicillin: Secondary | ICD-10-CM | POA: Diagnosis not present

## 2014-06-06 DIAGNOSIS — R002 Palpitations: Secondary | ICD-10-CM | POA: Diagnosis present

## 2014-06-06 LAB — TROPONIN I: Troponin I: <0.03 ng/mL (ref <0.031)

## 2014-06-06 LAB — CBC WITH DIFFERENTIAL/PLATELET
Basophils Absolute: 0 10*3/uL (ref 0.0–0.1)
Basophils Relative: 0 % (ref 0–1)
Eosinophils Absolute: 0.2 10*3/uL (ref 0.0–0.7)
Eosinophils Relative: 3 % (ref 0–5)
HCT: 44.5 % (ref 39.0–52.0)
Hemoglobin: 16.2 g/dL (ref 13.0–17.0)
LYMPHS ABS: 1.1 10*3/uL (ref 0.7–4.0)
LYMPHS PCT: 16 % (ref 12–46)
MCH: 32.7 pg (ref 26.0–34.0)
MCHC: 36.4 g/dL — ABNORMAL HIGH (ref 30.0–36.0)
MCV: 89.7 fL (ref 78.0–100.0)
MONOS PCT: 8 % (ref 3–12)
Monocytes Absolute: 0.6 10*3/uL (ref 0.1–1.0)
NEUTROS PCT: 73 % (ref 43–77)
Neutro Abs: 4.9 10*3/uL (ref 1.7–7.7)
Platelets: 155 10*3/uL (ref 150–400)
RBC: 4.96 MIL/uL (ref 4.22–5.81)
RDW: 12.8 % (ref 11.5–15.5)
WBC: 6.8 10*3/uL (ref 4.0–10.5)

## 2014-06-06 LAB — BASIC METABOLIC PANEL
Anion gap: 8 (ref 5–15)
BUN: 15 mg/dL (ref 6–23)
CHLORIDE: 104 meq/L (ref 96–112)
CO2: 24 mmol/L (ref 19–32)
Calcium: 9.3 mg/dL (ref 8.4–10.5)
Creatinine, Ser: 0.89 mg/dL (ref 0.50–1.35)
GFR calc Af Amer: >90 mL/min (ref >90)
GFR calc non Af Amer: >90 mL/min (ref >90)
GLUCOSE: 198 mg/dL — AB (ref 70–99)
Potassium: 4 mmol/L (ref 3.5–5.1)
SODIUM: 136 mmol/L (ref 135–145)

## 2014-06-06 LAB — BRAIN NATRIURETIC PEPTIDE: B Natriuretic Peptide: 15.5 pg/mL (ref 0.0–100.0)

## 2014-06-06 MED ORDER — SODIUM CHLORIDE 0.9 % IV BOLUS (SEPSIS)
500.0000 mL | Freq: Once | INTRAVENOUS | Status: AC
  Administered 2014-06-06: 500 mL via INTRAVENOUS

## 2014-06-06 MED ORDER — METOPROLOL SUCCINATE ER 25 MG PO TB24
25.0000 mg | ORAL_TABLET | Freq: Every day | ORAL | Status: DC
  Administered 2014-06-06: 25 mg via ORAL
  Filled 2014-06-06: qty 1

## 2014-06-06 MED ORDER — METOPROLOL TARTRATE 50 MG PO TABS
ORAL_TABLET | ORAL | Status: AC
  Filled 2014-06-06: qty 1

## 2014-06-06 MED ORDER — DILTIAZEM HCL 25 MG/5ML IV SOLN
20.0000 mg | Freq: Once | INTRAVENOUS | Status: AC
  Administered 2014-06-06: 20 mg via INTRAVENOUS
  Filled 2014-06-06: qty 5

## 2014-06-06 NOTE — ED Provider Notes (Signed)
CSN: [id number]     Arrival date & time 06/06/14  1337 History   First MD Initiated Contact with Patient 06/06/14 1359     Chief Complaint  Patient presents with  . Palpitations     (Consider location/radiation/quality/duration/timing/severity/associated sxs/prior Treatment) Patient is a 53 y.o. male presenting with palpitations. The history is provided by the patient and the spouse.  Palpitations Palpitations quality:  Fast Onset quality:  Unable to specify Duration:  5 hours Timing:  Constant Progression:  Unchanged Chronicity:  New Context comment:  Had 3 beers yesterday, does not normally drink Relieved by:  Nothing Worsened by:  Nothing tried Ineffective treatments: home metoprolol. Associated symptoms: dizziness and shortness of breath   Associated symptoms: no back pain, no chest pain, no cough, no nausea, no numbness and no vomiting   Risk factors comment:  Hx of a fib   Past Medical History  Diagnosis Date  . Allergy   . Atrial fibrillation   . Bulging lumbar disc    Past Surgical History  Procedure Laterality Date  . Vasectomy    . Nasal septum surgery    . Knee arthroplasty     Family History  Problem Relation Age of Onset  . Heart disease Mother   . Stroke Paternal Grandmother    History  Substance Use Topics  . Smoking status: Never Smoker   . Smokeless tobacco: Not on file  . Alcohol Use: No    Review of Systems  Constitutional: Negative for fever, activity change, appetite change and fatigue.  HENT: Negative for congestion, facial swelling, rhinorrhea and trouble swallowing.   Eyes: Negative for photophobia and pain.  Respiratory: Positive for shortness of breath. Negative for cough and chest tightness.   Cardiovascular: Positive for palpitations. Negative for chest pain and leg swelling.  Gastrointestinal: Negative for nausea, vomiting, abdominal pain, diarrhea and constipation.  Endocrine: Negative for polydipsia and polyuria.   Genitourinary: Negative for dysuria, urgency, decreased urine volume and difficulty urinating.  Musculoskeletal: Negative for back pain and gait problem.  Skin: Negative for color change, rash and wound.  Allergic/Immunologic: Negative for immunocompromised state.  Neurological: Positive for dizziness. Negative for facial asymmetry, speech difficulty, weakness, numbness and headaches.  Psychiatric/Behavioral: Negative for confusion, decreased concentration and agitation.      Allergies  Penicillins  Home Medications   Prior to Admission medications   Medication Sig Start Date End Date Taking? Authorizing Provider  allopurinol (ZYLOPRIM) 300 MG tablet TAKE ONE TABLET BY MOUTH TWICE DAILY 05/09/14   Monica Bectonhomas J Thekkekandam, MD  aspirin EC 81 MG tablet Take 1 tablet (81 mg total) by mouth daily. 06/10/12   Monica Bectonhomas J Thekkekandam, MD  diclofenac (VOLTAREN) 75 MG EC tablet Take 1 tablet (75 mg total) by mouth 2 (two) times daily. 10/13/13   Monica Bectonhomas J Thekkekandam, MD  metoprolol succinate (TOPROL-XL) 25 MG 24 hr tablet Take 1 tablet (25 mg total) by mouth daily. 08/06/12   Monica Bectonhomas J Thekkekandam, MD  nitroGLYCERIN (NITRODUR - DOSED IN MG/24 HR) 0.2 mg/hr patch Cut and apply 1/4 patch to most painful area q24h. 10/13/13   Monica Bectonhomas J Thekkekandam, MD   BP 113/84 mmHg  Pulse 168  Temp(Src) 98.1 F (36.7 C) (Oral)  Resp 16  Ht 5\' 10"  (1.778 m)  Wt 225 lb (102.059 kg)  BMI 32.28 kg/m2  SpO2 100% Physical Exam  Constitutional: He is oriented to person, place, and time. He appears well-developed and well-nourished. No distress.  HENT:  Head: Normocephalic and atraumatic.  Mouth/Throat: No oropharyngeal exudate.  Eyes: Pupils are equal, round, and reactive to light.  Neck: Normal range of motion. Neck supple.  Cardiovascular: Normal heart sounds.  An irregularly irregular rhythm present. Tachycardia present.  Exam reveals no gallop and no friction rub.   No murmur heard. Pulmonary/Chest: Effort normal  and breath sounds normal. No respiratory distress. He has no wheezes. He has no rales.  Abdominal: Soft. Bowel sounds are normal. He exhibits no distension and no mass. There is no tenderness. There is no rebound and no guarding.  Musculoskeletal: Normal range of motion. He exhibits no edema or tenderness.  Neurological: He is alert and oriented to person, place, and time.  Skin: Skin is warm and dry.  Psychiatric: He has a normal mood and affect.    ED Course  Procedures (including critical care time) Labs Review Labs Reviewed  CBC WITH DIFFERENTIAL - Abnormal; Notable for the following:    MCHC 36.4 (*)    All other components within normal limits  BASIC METABOLIC PANEL  TROPONIN I  BRAIN NATRIURETIC PEPTIDE    Imaging Review No results found.   EKG Interpretation None      MDM   Final diagnoses:  Atrial fibrillation with RVR    Pt is a 53 y.o. male with Pmhx as above who presents with tachy-palpitations, SOB, and lightheadedness since getting up this morning, c/w prior episode of afib. No CP. On PE, HR 120-160 in a fib.    HR improved to 80-100s, though still in a fib. CHA2DS2-VASc score is 0 points. I [id number], who was on-call for patient's cardiologist, Dr. Sampson GoonFitzgerald, he recommends transfer for admission to [facility] for continued care.  This is been discussed with patient who agrees with transfer and admission.    Toy CookeyMegan Docherty, MD 06/06/14 [phone]

## 2014-06-06 NOTE — ED Notes (Signed)
Pt c/o palpitations and SOB this am HX afib

## 2014-06-06 NOTE — ED Notes (Signed)
Pt ambulated to the restroom with no difficulty. Reports feeling better

## 2014-06-12 ENCOUNTER — Ambulatory Visit: Payer: BC Managed Care – PPO | Admitting: Sports Medicine

## 2014-06-16 ENCOUNTER — Ambulatory Visit (INDEPENDENT_AMBULATORY_CARE_PROVIDER_SITE_OTHER): Payer: BLUE CROSS/BLUE SHIELD

## 2014-06-16 ENCOUNTER — Ambulatory Visit (INDEPENDENT_AMBULATORY_CARE_PROVIDER_SITE_OTHER): Payer: BLUE CROSS/BLUE SHIELD | Admitting: Sports Medicine

## 2014-06-16 ENCOUNTER — Encounter: Payer: Self-pay | Admitting: Sports Medicine

## 2014-06-16 VITALS — BP 147/93 | HR 83 | Ht 70.0 in | Wt 229.0 lb

## 2014-06-16 DIAGNOSIS — I480 Paroxysmal atrial fibrillation: Secondary | ICD-10-CM | POA: Diagnosis not present

## 2014-06-16 DIAGNOSIS — R0602 Shortness of breath: Secondary | ICD-10-CM

## 2014-06-16 DIAGNOSIS — R05 Cough: Secondary | ICD-10-CM

## 2014-06-16 DIAGNOSIS — M1711 Unilateral primary osteoarthritis, right knee: Secondary | ICD-10-CM | POA: Diagnosis not present

## 2014-06-16 DIAGNOSIS — I951 Orthostatic hypotension: Secondary | ICD-10-CM

## 2014-06-16 DIAGNOSIS — R0789 Other chest pain: Secondary | ICD-10-CM

## 2014-06-16 MED ORDER — AMBULATORY NON FORMULARY MEDICATION: Status: AC

## 2014-06-16 NOTE — Progress Notes (Signed)
  Subjective:    CC: Hospital follow-up  HPI: Atrial fibrillation: Hospitalized for chemical cardioversion, currently on flecainide, in sinus rhythm. Doing well. He does have follow-up with cardiology.  Right knee: Post arthroscopy, having some swelling, and a bit of pain, no symptoms of infection. No constitutional symptoms.  Orthostasis: Lately has noted some dizziness, and presyncope when standing up quickly. Denies any chest pain or palpitations.  Past medical history, Surgical history, Family history not pertinant except as noted below, Social history, Allergies, and medications have been entered into the medical record, reviewed, and no changes needed.   Review of Systems: No fevers, chills, night sweats, weight loss, chest pain, or shortness of breath.   Objective:    General: Well Developed, well nourished, and in no acute distress.  Neuro: Alert and oriented x3, extra-ocular muscles intact, sensation grossly intact.  HEENT: Normocephalic, atraumatic, pupils equal round reactive to light, neck supple, no masses, no lymphadenopathy, thyroid nonpalpable.  Skin: Warm and dry, no rashes. Cardiac: Regular rate and rhythm, no murmurs rubs or gallops, no lower extremity edema.  Respiratory: Clear to auscultation bilaterally. Not using accessory muscles, speaking in full sentences. Right Knee: Visible and palpable effusion, well healed arthroscopy portals. ROM normal in flexion and extension and lower leg rotation. Ligaments with solid consistent endpoints including ACL, PCL, LCL, MCL. Negative Mcmurray's and provocative meniscal tests. Non painful patellar compression. Patellar and quadriceps tendons unremarkable. Hamstring and quadriceps strength is normal.  Procedure: Real-time Ultrasound Guided Injection of right knee Device: GE Logiq E  Verbal informed consent obtained.  Time-out conducted.  Noted no overlying erythema, induration, or other signs of local infection.  Skin  prepped in a sterile fashion.  Local anesthesia: Topical Ethyl chloride.  With sterile technique and under real time ultrasound guidance:  15 mL of straw-colored fluid aspirated, syringe switched and 1 mL kenalog 40, 4 mL lidocaine injected easily. Completed without difficulty  Pain immediately resolved suggesting accurate placement of the medication.  Advised to call if fevers/chills, erythema, induration, drainage, or persistent bleeding.  Images permanently stored and available for review in the ultrasound unit.  Impression: Technically successful ultrasound guided injection.  Impression and Recommendations:

## 2014-06-16 NOTE — Assessment & Plan Note (Signed)
Current medications are essential. I'm going to add lower extremity compression stockings.

## 2014-06-16 NOTE — Assessment & Plan Note (Signed)
Postarthroscopy. He does have an effusion, aspiration and injection as above.

## 2014-06-16 NOTE — Assessment & Plan Note (Addendum)
Has been discharged from the hospital, he did undergo chemical cardioversion with flecainide, currently in normal sinus. Continues to have minor symptoms including occasional difficulty catching his breath. Considering recent orthopedic surgery we are going to do some blood work including a d-dimer, chest x-ray.

## 2014-06-17 LAB — COMPREHENSIVE METABOLIC PANEL WITH GFR
AST: 25 U/L (ref 0–37)
Alkaline Phosphatase: 54 U/L (ref 39–117)
Calcium: 9.6 mg/dL (ref 8.4–10.5)
Chloride: 101 meq/L (ref 96–112)
Potassium: 4.3 meq/L (ref 3.5–5.3)

## 2014-06-17 LAB — COMPREHENSIVE METABOLIC PANEL
ALT: 36 U/L (ref 0–53)
Albumin: 4.7 g/dL (ref 3.5–5.2)
BUN: 16 mg/dL (ref 6–23)
CO2: 27 mEq/L (ref 19–32)
Creat: 1.11 mg/dL (ref 0.50–1.35)
Glucose, Bld: 111 mg/dL — ABNORMAL HIGH (ref 70–99)
Sodium: 138 mEq/L (ref 135–145)
Total Bilirubin: 1.4 mg/dL — ABNORMAL HIGH (ref 0.2–1.2)
Total Protein: 6.9 g/dL (ref 6.0–8.3)

## 2014-06-17 LAB — CBC
HCT: 41.8 % (ref 39.0–52.0)
Hemoglobin: 14.5 g/dL (ref 13.0–17.0)
MCH: 32.1 pg (ref 26.0–34.0)
MCHC: 34.7 g/dL (ref 30.0–36.0)
MCV: 92.5 fL (ref 78.0–100.0)
MPV: 10.6 fL (ref 8.6–12.4)
Platelets: 173 10*3/uL (ref 150–400)
RBC: 4.52 MIL/uL (ref 4.22–5.81)
RDW: 13.4 % (ref 11.5–15.5)
WBC: 6.2 10*3/uL (ref 4.0–10.5)

## 2014-06-17 LAB — TSH: TSH: 1.657 u[IU]/mL (ref 0.350–4.500)

## 2014-06-17 LAB — D-DIMER, QUANTITATIVE: D-Dimer, Quant: 0.32 ug/mL-FEU (ref 0.00–0.48)

## 2014-06-27 ENCOUNTER — Other Ambulatory Visit: Payer: Self-pay | Admitting: Sports Medicine

## 2014-06-27 MED ORDER — ALLOPURINOL 300 MG PO TABS: 300.0000 mg | ORAL_TABLET | Freq: Two times a day (BID) | ORAL | Status: DC

## 2014-10-24 ENCOUNTER — Encounter: Payer: Self-pay | Admitting: Sports Medicine

## 2014-10-24 ENCOUNTER — Ambulatory Visit (INDEPENDENT_AMBULATORY_CARE_PROVIDER_SITE_OTHER): Payer: BLUE CROSS/BLUE SHIELD | Admitting: Sports Medicine

## 2014-10-24 VITALS — BP 147/56 | HR 70 | Ht 70.0 in | Wt 234.0 lb

## 2014-10-24 DIAGNOSIS — M1711 Unilateral primary osteoarthritis, right knee: Secondary | ICD-10-CM

## 2014-10-24 DIAGNOSIS — Z0000 Encounter for general adult medical examination without abnormal findings: Secondary | ICD-10-CM | POA: Diagnosis not present

## 2014-10-24 DIAGNOSIS — I951 Orthostatic hypotension: Secondary | ICD-10-CM | POA: Diagnosis not present

## 2014-10-24 NOTE — Assessment & Plan Note (Signed)
Persistent pain in mechanical popping postarthroscopy, we do need a new MRI.

## 2014-10-24 NOTE — Progress Notes (Signed)
  Subjective:    CC: Complete physical exam  HPI:  Right knee osteoarthritis: Continues to have pain post arthroscopy, pain is predominantly lung the medial joint line, and he feels no mechanical symptoms.  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  Past medical history, Surgical history, Family history not pertinant except as noted below, Social history, Allergies, and medications have been entered into the medical record, reviewed, and no changes needed.   Review of Systems: No headache, visual changes, nausea, vomiting, diarrhea, constipation, dizziness, abdominal pain, skin rash, fevers, chills, night sweats, swollen lymph nodes, weight loss, chest pain, body aches, joint swelling, muscle aches, shortness of breath, mood changes, visual or auditory hallucinations.  Objective:   General: Well Developed, well nourished, and in no acute distress.  Neuro: Alert and oriented x3, extra-ocular muscles intact, sensation grossly intact. Cranial nerves II through XII are intact, motor, sensory, and coordinative functions are all intact. HEENT: Normocephalic, atraumatic, pupils equal round reactive to light, neck supple, no masses, no lymphadenopathy, thyroid nonpalpable. Oropharynx, nasopharynx, external ear canals are unremarkable. Skin: Warm and dry, no rashes noted.  Cardiac: Regular rate and rhythm, no murmurs rubs or gallops.  Respiratory: Clear to auscultation bilaterally. Not using accessory muscles, speaking in full sentences.  Abdominal: Soft, nontender, nondistended, positive bowel sounds, no masses, no organomegaly.  Musculoskeletal: Shoulder, elbow, wrist, hip, knee, ankle stable, and with full range of motion. Rectal: Good tone, smooth but slightly enlarged prostate, Hemoccult negative.  Impression and Recommendations:    The patient was counselled, risk factors were discussed, anticipatory guidance  given.

## 2014-10-24 NOTE — Assessment & Plan Note (Signed)
Improved but has only been wearing compression hose for a week now.

## 2014-10-24 NOTE — Assessment & Plan Note (Signed)
Normal physical. Negative Hemoccult. Routine screening blood work ordered.

## 2014-10-27 ENCOUNTER — Other Ambulatory Visit: Payer: Self-pay | Admitting: Sports Medicine

## 2014-10-27 DIAGNOSIS — Z1389 Encounter for screening for other disorder: Secondary | ICD-10-CM

## 2014-10-30 ENCOUNTER — Ambulatory Visit (INDEPENDENT_AMBULATORY_CARE_PROVIDER_SITE_OTHER): Payer: BLUE CROSS/BLUE SHIELD

## 2014-10-30 ENCOUNTER — Ambulatory Visit: Payer: BLUE CROSS/BLUE SHIELD

## 2014-10-30 DIAGNOSIS — M1711 Unilateral primary osteoarthritis, right knee: Secondary | ICD-10-CM

## 2014-10-30 DIAGNOSIS — M25461 Effusion, right knee: Secondary | ICD-10-CM

## 2014-11-04 LAB — COMPREHENSIVE METABOLIC PANEL
ALT: 31 U/L (ref 0–53)
AST: 23 U/L (ref 0–37)
Albumin: 4.3 g/dL (ref 3.5–5.2)
Alkaline Phosphatase: 53 U/L (ref 39–117)
CO2: 27 mEq/L (ref 19–32)
Calcium: 8.9 mg/dL (ref 8.4–10.5)
Chloride: 104 mEq/L (ref 96–112)
Glucose, Bld: 105 mg/dL — ABNORMAL HIGH (ref 70–99)
Potassium: 4.3 mEq/L (ref 3.5–5.3)
Sodium: 140 mEq/L (ref 135–145)
Total Bilirubin: 1.2 mg/dL (ref 0.2–1.2)

## 2014-11-04 LAB — COMPREHENSIVE METABOLIC PANEL WITH GFR
BUN: 29 mg/dL — ABNORMAL HIGH (ref 6–23)
Creat: 0.97 mg/dL (ref 0.50–1.35)
Total Protein: 6.2 g/dL (ref 6.0–8.3)

## 2014-11-04 LAB — CBC
HCT: 41.8 % (ref 39.0–52.0)
Hemoglobin: 14.4 g/dL (ref 13.0–17.0)
MCH: 31.5 pg (ref 26.0–34.0)
MCHC: 34.4 g/dL (ref 30.0–36.0)
MCV: 91.5 fL (ref 78.0–100.0)
MPV: 10.1 fL (ref 8.6–12.4)
Platelets: 145 10*3/uL — ABNORMAL LOW (ref 150–400)
RBC: 4.57 MIL/uL (ref 4.22–5.81)
RDW: 13.4 % (ref 11.5–15.5)
WBC: 5 K/uL (ref 4.0–10.5)

## 2014-11-04 LAB — LIPID PANEL
Cholesterol: 146 mg/dL (ref 0–200)
HDL: 33 mg/dL — ABNORMAL LOW (ref >=40)
LDL Cholesterol: 96 mg/dL (ref 0–99)
Total CHOL/HDL Ratio: 4.4 ratio
Triglycerides: 86 mg/dL (ref <150)
VLDL: 17 mg/dL (ref 0–40)

## 2014-11-04 LAB — HEMOGLOBIN A1C
Hgb A1c MFr Bld: 5.9 % — ABNORMAL HIGH (ref <5.7)
Mean Plasma Glucose: 123 mg/dL — ABNORMAL HIGH (ref <117)

## 2014-11-04 LAB — URIC ACID: Uric Acid, Serum: 3.8 mg/dL — ABNORMAL LOW (ref 4.0–7.8)

## 2014-11-04 LAB — VITAMIN D 25 HYDROXY (VIT D DEFICIENCY, FRACTURES): Vit D, 25-Hydroxy: 32 ng/mL (ref 30–100)

## 2014-11-04 LAB — TSH: TSH: 2.616 u[IU]/mL (ref 0.350–4.500)

## 2014-11-08 ENCOUNTER — Other Ambulatory Visit: Payer: Self-pay | Admitting: Sports Medicine

## 2014-11-23 ENCOUNTER — Other Ambulatory Visit: Payer: Self-pay | Admitting: Sports Medicine

## 2014-11-24 NOTE — Telephone Encounter (Signed)
Correction: last Rx for diclofenac 75mg  was written in 10/2013. Refill not appropriate.

## 2014-12-22 ENCOUNTER — Telehealth: Payer: Self-pay

## 2014-12-22 DIAGNOSIS — M7712 Lateral epicondylitis, left elbow: Secondary | ICD-10-CM

## 2014-12-22 MED ORDER — DICLOFENAC SODIUM 75 MG PO TBEC: 75.0000 mg | DELAYED_RELEASE_TABLET | Freq: Two times a day (BID) | ORAL | Status: DC

## 2014-12-22 NOTE — Telephone Encounter (Signed)
Refilled diclofenac

## 2015-01-19 IMAGING — CR DG CHEST 2V
2 series · 2 of 2 positions shown · non-contrast
Comparison: 06/06/2014

CLINICAL DATA: Initial evaluation for cough shortness of breath

EXAM:
CHEST  2 VIEW

[view not recorded (1 of 2)]
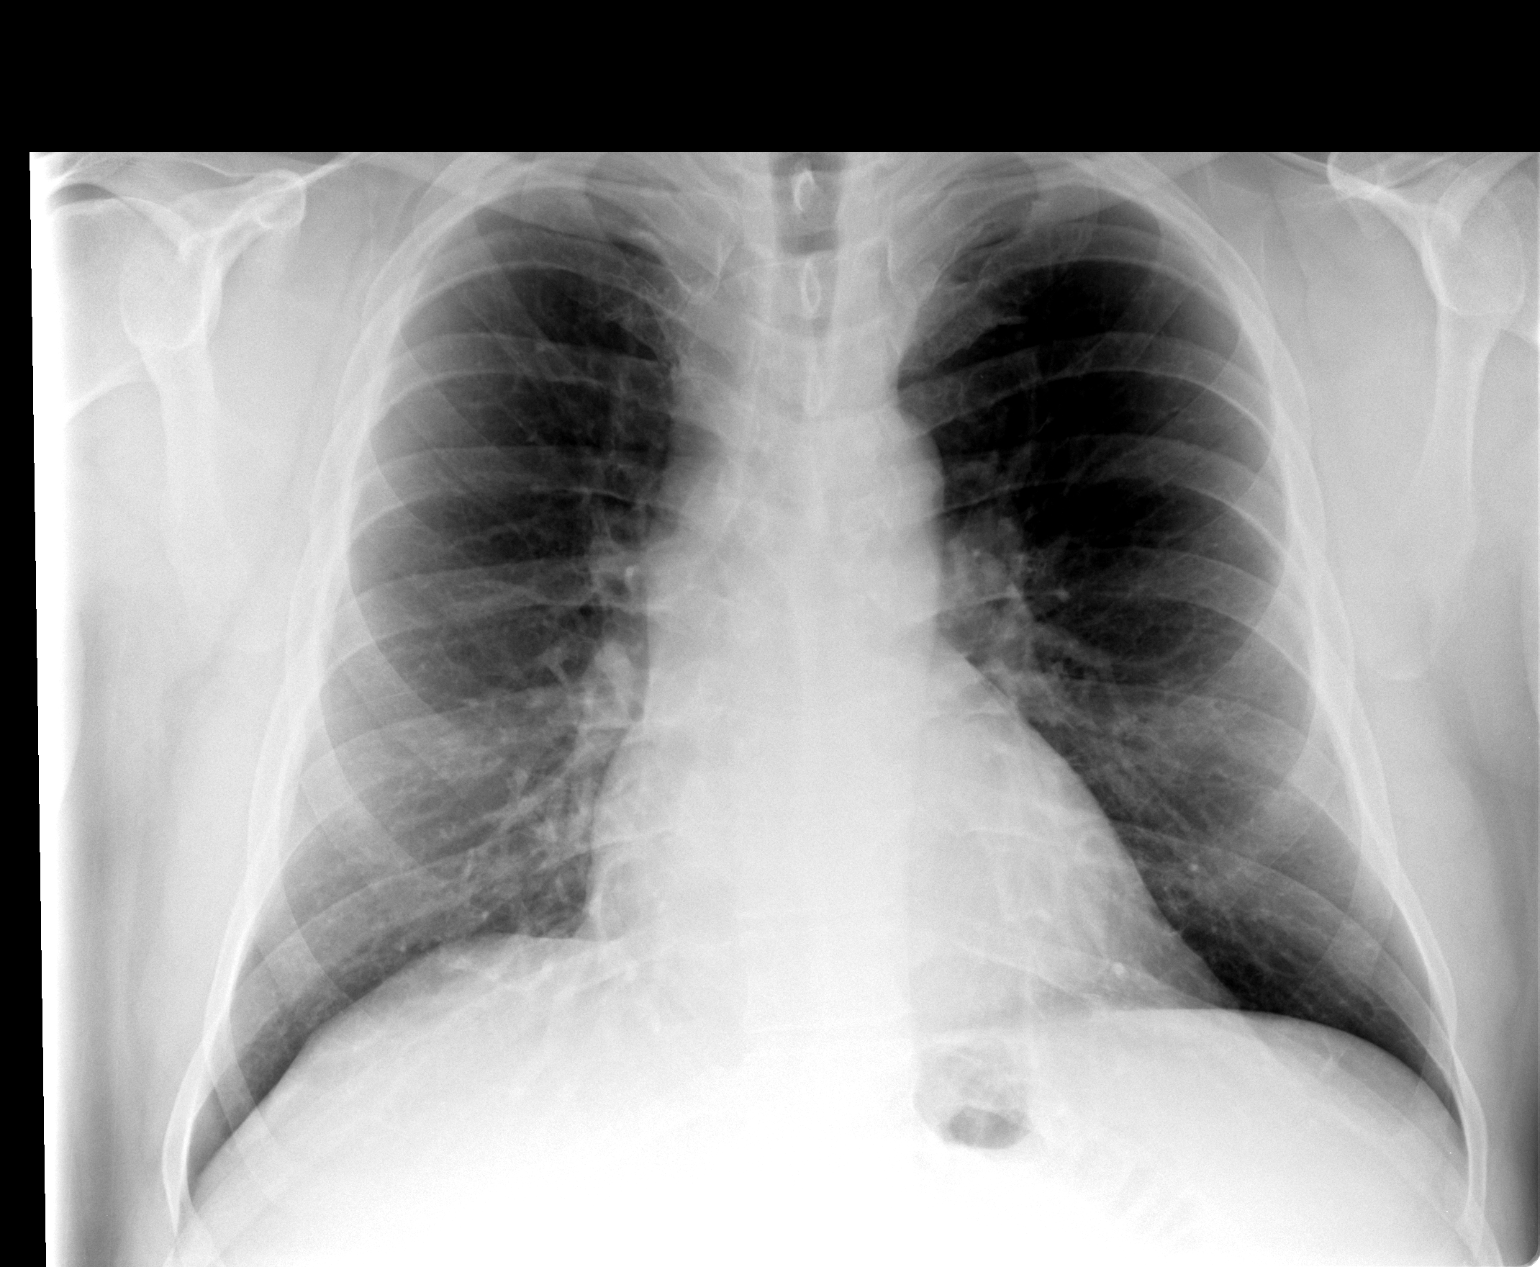

[view not recorded (2 of 2)]
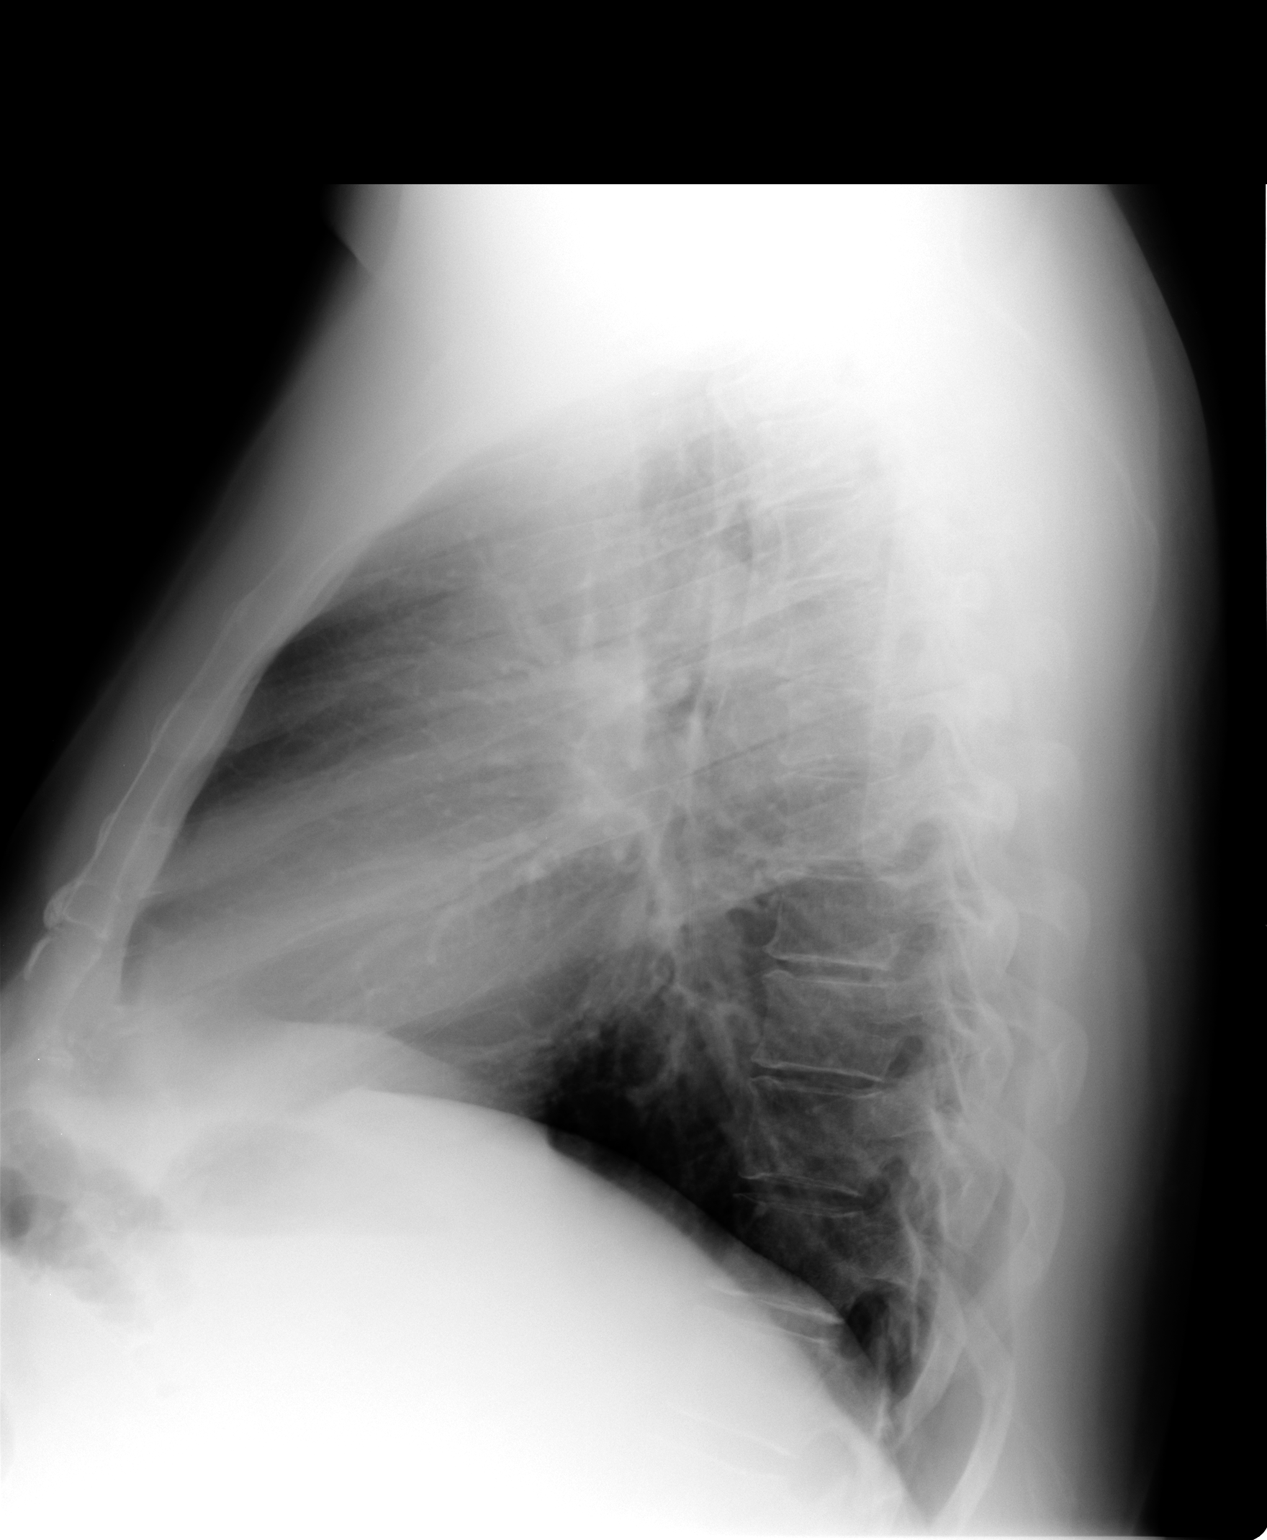

[2 of 2 positions shown; findings below may reference images not displayed]

FINDINGS: The heart size and mediastinal contours are within normal limits.
Both lungs are clear. The visualized skeletal structures are
unremarkable.
IMPRESSION: No active cardiopulmonary disease.

## 2015-01-22 ENCOUNTER — Other Ambulatory Visit: Payer: Self-pay | Admitting: Sports Medicine

## 2015-02-18 ENCOUNTER — Other Ambulatory Visit: Payer: Self-pay | Admitting: Sports Medicine

## 2015-03-15 ENCOUNTER — Ambulatory Visit (INDEPENDENT_AMBULATORY_CARE_PROVIDER_SITE_OTHER): Payer: BLUE CROSS/BLUE SHIELD | Admitting: Sports Medicine

## 2015-03-15 ENCOUNTER — Encounter: Payer: Self-pay | Admitting: Sports Medicine

## 2015-03-15 VITALS — BP 121/76 | HR 86 | Ht 70.0 in | Wt 233.0 lb

## 2015-03-15 DIAGNOSIS — M129 Arthropathy, unspecified: Secondary | ICD-10-CM

## 2015-03-15 DIAGNOSIS — S61209A Unspecified open wound of unspecified finger without damage to nail, initial encounter: Secondary | ICD-10-CM

## 2015-03-15 DIAGNOSIS — IMO0002 Reserved for concepts with insufficient information to code with codable children: Secondary | ICD-10-CM | POA: Insufficient documentation

## 2015-03-15 DIAGNOSIS — M19071 Primary osteoarthritis, right ankle and foot: Secondary | ICD-10-CM | POA: Insufficient documentation

## 2015-03-15 DIAGNOSIS — M19072 Primary osteoarthritis, left ankle and foot: Secondary | ICD-10-CM

## 2015-03-15 NOTE — Assessment & Plan Note (Signed)
Calcaneocuboid joint injection as above.  Return to see me in 4 weeks.

## 2015-03-15 NOTE — Progress Notes (Signed)
  Subjective:    CC: Left foot swelling  HPI: For the past week this pleasant 54 year old male has noted increasing swelling and pain over the dorsum of his left foot, no trauma, moderate, persistent, no radiation.  2 months ago he also had an avulsion of the right thumbnail, he has been duct taping it however still has significant sensitivity at the tip of the thumb.  Past medical history, Surgical history, Family history not pertinant except as noted below, Social history, Allergies, and medications have been entered into the medical record, reviewed, and no changes needed.   Review of Systems: No fevers, chills, night sweats, weight loss, chest pain, or shortness of breath.   Objective:    General: Well Developed, well nourished, and in no acute distress.  Neuro: Alert and oriented x3, extra-ocular muscles intact, sensation grossly intact.  HEENT: Normocephalic, atraumatic, pupils equal round reactive to light, neck supple, no masses, no lymphadenopathy, thyroid nonpalpable.  Skin: Warm and dry, no rashes. Cardiac: Regular rate and rhythm, no murmurs rubs or gallops, no lower extremity edema.  Respiratory: Clear to auscultation bilaterally. Not using accessory muscles, speaking in full sentences. Left Foot: Only minimally swollen with mild tenderness over the calcaneocuboid joint Range of motion is full in all directions. Strength is 5/5 in all directions. No hallux valgus. No pes cavus or pes planus. No abnormal callus noted. No pain over the navicular prominence, or base of fifth metatarsal. No tenderness to palpation of the calcaneal insertion of plantar fascia. No pain at the Achilles insertion. No pain over the calcaneal bursa. No pain of the retrocalcaneal bursa. No tenderness to palpation over the tarsals, metatarsals, or phalanges. No hallux rigidus or limitus. No tenderness palpation over interphalangeal joints. No pain with compression of the metatarsal  heads. Neurovascularly intact distally. Right hand: Full avulsion of thumbnail, with new nail growing out.  Static splint applied.  Procedure: Real-time Ultrasound Guided Injection of  left calcaneocuboid joint Device: GE Logiq E  Verbal informed consent obtained.  Time-out conducted.  Noted no overlying erythema, induration, or other signs of local infection.  Skin prepped in a sterile fashion.  Local anesthesia: Topical Ethyl chloride.  With sterile technique and under real time ultrasound guidance:  1 mL kenalog 40, 1 mL lidocaine injected easily. Completed without difficulty  Pain immediately resolved suggesting accurate placement of the medication.  Advised to call if fevers/chills, erythema, induration, drainage, or persistent bleeding.  Images permanently stored and available for review in the ultrasound unit.  Impression: Technically successful ultrasound guided injection.  Impression and Recommendations:

## 2015-03-15 NOTE — Assessment & Plan Note (Signed)
Accidental avulsion of the right thumbnail 2 months ago, new nail is starting to grow, I did apply a static thumb splint. Return as needed.

## 2015-04-13 ENCOUNTER — Ambulatory Visit: Payer: BLUE CROSS/BLUE SHIELD | Admitting: Sports Medicine

## 2015-04-16 ENCOUNTER — Other Ambulatory Visit: Payer: Self-pay | Admitting: Sports Medicine

## 2015-05-30 ENCOUNTER — Other Ambulatory Visit: Payer: Self-pay | Admitting: Sports Medicine

## 2015-06-26 ENCOUNTER — Encounter: Payer: Self-pay | Admitting: Sports Medicine

## 2015-06-26 ENCOUNTER — Ambulatory Visit (INDEPENDENT_AMBULATORY_CARE_PROVIDER_SITE_OTHER): Payer: BLUE CROSS/BLUE SHIELD | Admitting: Sports Medicine

## 2015-06-26 VITALS — BP 148/99 | HR 72 | Resp 18 | Wt 239.8 lb

## 2015-06-26 DIAGNOSIS — M19071 Primary osteoarthritis, right ankle and foot: Secondary | ICD-10-CM | POA: Diagnosis not present

## 2015-06-26 DIAGNOSIS — M19072 Primary osteoarthritis, left ankle and foot: Secondary | ICD-10-CM

## 2015-06-26 NOTE — Assessment & Plan Note (Addendum)
Bilateral fourth metatarsal/cuboid joint injections as above. Return as needed. As efficacy wanes we will ultimately need bilateral MRIs and referral to foot and ankle surgery.

## 2015-06-26 NOTE — Progress Notes (Signed)
  Subjective:    CC: follow-up  HPI: Bilateral foot arthritis: Did well with a left calcaneocuboid joint injections the last visit, 3 months ago, now has pain that he localizes over the midfoot/forefoot junction bilaterally, moderate, persistent without radiation past the ankle.  Past medical history, Surgical history, Family history not pertinant except as noted below, Social history, Allergies, and medications have been entered into the medical record, reviewed, and no changes needed.   Review of Systems: No fevers, chills, night sweats, weight loss, chest pain, or shortness of breath.   Objective:    General: Well Developed, well nourished, and in no acute distress.  Neuro: Alert and oriented x3, extra-ocular muscles intact, sensation grossly intact.  HEENT: Normocephalic, atraumatic, pupils equal round reactive to light, neck supple, no masses, no lymphadenopathy, thyroid nonpalpable.  Skin: Warm and dry, no rashes. Cardiac: Regular rate and rhythm, no murmurs rubs or gallops, no lower extremity edema.  Respiratory: Clear to auscultation bilaterally. Not using accessory muscles, speaking in full sentences. Bilateral feet: No visible erythema or swelling. Range of motion is full in all directions. Strength is 5/5 in all directions. No hallux valgus. No pes cavus or pes planus. No abnormal callus noted. No pain over the navicular prominence, or base of fifth metatarsal. No tenderness to palpation of the calcaneal insertion of plantar fascia. No pain at the Achilles insertion. No pain over the calcaneal bursa. No pain of the retrocalcaneal bursa. No tenderness to palpation over the tarsals, metatarsals, or phalanges. No hallux rigidus or limitus. Tender to palpation at the fourth metatarsocuboid joint bilaterally. No pain with compression of the metatarsal heads. Neurovascularly intact distally.  Procedure: Real-time Ultrasound Guided Injection of right fourth metatarsocuboid  joint Device: GE Logiq E  Verbal informed consent obtained.  Time-out conducted.  Noted no overlying erythema, induration, or other signs of local infection.  Skin prepped in a sterile fashion.  Local anesthesia: Topical Ethyl chloride.  With sterile technique and under real time ultrasound guidance:  0.5 mL kenalog 40, 1 mL lidocaine injected easily Completed without difficulty  Pain immediately resolved suggesting accurate placement of the medication.  Advised to call if fevers/chills, erythema, induration, drainage, or persistent bleeding.  Images permanently stored and available for review in the ultrasound unit.  Impression: Technically successful ultrasound guided injection.  Procedure: Real-time Ultrasound Guided Injection of left fourth metatarsocuboid joint Device: GE Logiq E  Verbal informed consent obtained.  Time-out conducted.  Noted no overlying erythema, induration, or other signs of local infection.  Skin prepped in a sterile fashion.  Local anesthesia: Topical Ethyl chloride.  With sterile technique and under real time ultrasound guidance:  0.5 mL kenalog 40, 1 mL lidocaine injected easily Completed without difficulty  Pain immediately resolved suggesting accurate placement of the medication.  Advised to call if fevers/chills, erythema, induration, drainage, or persistent bleeding.  Images permanently stored and available for review in the ultrasound unit.  Impression: Technically successful ultrasound guided injection.  Impression and Recommendations:

## 2015-07-18 ENCOUNTER — Ambulatory Visit (INDEPENDENT_AMBULATORY_CARE_PROVIDER_SITE_OTHER): Payer: BLUE CROSS/BLUE SHIELD

## 2015-07-18 ENCOUNTER — Encounter: Payer: Self-pay | Admitting: Osteopathic Medicine

## 2015-07-18 ENCOUNTER — Ambulatory Visit (INDEPENDENT_AMBULATORY_CARE_PROVIDER_SITE_OTHER): Payer: BLUE CROSS/BLUE SHIELD | Admitting: Osteopathic Medicine

## 2015-07-18 VITALS — BP 128/73 | HR 86 | Temp 98.2°F | Ht 70.0 in | Wt 231.0 lb

## 2015-07-18 DIAGNOSIS — R059 Cough, unspecified: Secondary | ICD-10-CM

## 2015-07-18 DIAGNOSIS — R0602 Shortness of breath: Secondary | ICD-10-CM

## 2015-07-18 DIAGNOSIS — B9789 Other viral agents as the cause of diseases classified elsewhere: Secondary | ICD-10-CM

## 2015-07-18 DIAGNOSIS — Z8709 Personal history of other diseases of the respiratory system: Secondary | ICD-10-CM | POA: Diagnosis not present

## 2015-07-18 DIAGNOSIS — R05 Cough: Secondary | ICD-10-CM

## 2015-07-18 DIAGNOSIS — J069 Acute upper respiratory infection, unspecified: Secondary | ICD-10-CM | POA: Diagnosis not present

## 2015-07-18 DIAGNOSIS — J189 Pneumonia, unspecified organism: Secondary | ICD-10-CM

## 2015-07-18 MED ORDER — HYDROCODONE-HOMATROPINE 5-1.5 MG/5ML PO SYRP: 5.0000 mL | ORAL_SOLUTION | Freq: Four times a day (QID) | ORAL | Status: DC | PRN

## 2015-07-18 MED ORDER — IPRATROPIUM BROMIDE 0.03 % NA SOLN: 2.0000 | Freq: Two times a day (BID) | NASAL | Status: DC

## 2015-07-18 MED ORDER — ALBUTEROL SULFATE HFA 108 (90 BASE) MCG/ACT IN AERS: 2.0000 | INHALATION_SPRAY | Freq: Four times a day (QID) | RESPIRATORY_TRACT | Status: DC | PRN

## 2015-07-18 NOTE — Progress Notes (Signed)
HPI: William Ortega is a 55 y.o. male who presents to [facility]  today for chief complaint of:  Chief Complaint  Patient presents with  . Cough   COUGH . Location: chest, body aches . Quality: congestion, nonproductive cough, no hx smoking. Was feeling like sinuses few days ago  . Severity: moderate . Duration: 1 days . Modifying factors: has tried the following OTC medications: ibuprofen  without relief  Past medical, social and family history reviewed: Past Medical History  Diagnosis Date  . Allergy   . Atrial fibrillation (HCC)   . Bulging lumbar disc    Past Surgical History  Procedure Laterality Date  . Vasectomy    . Nasal septum surgery    . Knee arthroplasty     Social History  Substance Use Topics  . Smoking status: Never Smoker   . Smokeless tobacco: Not on file  . Alcohol Use: No   Family History  Problem Relation Age of Onset  . Heart disease Mother   . Stroke Paternal Grandmother     Current Outpatient Prescriptions  Medication Sig Dispense Refill  . allopurinol (ZYLOPRIM) 300 MG tablet TAKE 1 TABLET (300 MG TOTAL) BY MOUTH 2 (TWO) TIMES DAILY. 180 tablet 3  . AMBULATORY NON FORMULARY MEDICATION Knee-high, medium compression, graduated compression stockings. Apply to lower extremities. 1 each 0  . diclofenac (VOLTAREN) 75 MG EC tablet TAKE 1 TABLET BY MOUTH TWICE A DAY 60 tablet 1  . flecainide (TAMBOCOR) 50 MG tablet Take 25 mg by mouth 2 (two) times daily.    . metoprolol succinate (TOPROL-XL) 25 MG 24 hr tablet Take 1 tablet (25 mg total) by mouth daily. 30 tablet 2   No current facility-administered medications for this visit.   Allergies  Allergen Reactions  . Penicillins       Review of Systems: CONSTITUTIONAL: no fever/chills HEAD/EYES/EARS/NOSE/THROAT: no headache, no vision change or hearing change, mild sore throat CARDIAC: No chest pain/pressure/palpitations, no orthopnea RESPIRATORY: yes cough, no  shortness of breath GASTROINTESTINAL: no nausea, no vomiting, no abdominal pain/blood in stool/diarrhea/constipation MUSCULOSKELETAL: yes myalgia/arthralgia   Exam:  BP 128/73 mmHg  Pulse 86  Temp(Src) 98.2 F (36.8 C) (Oral)  Ht  (1.778 m)  Wt 231 lb (104.781 kg)  BMI 33.15 kg/m2 Constitutional: VSS, see above. General Appearance: alert, well-developed, well-nourished, NAD Eyes: Normal lids and conjunctive, non-icteric sclera, PERRLA Ears, Nose, Mouth, Throat: Normal external inspection ears/nares/mouth/lips/gums, normal TM on , MMM; posterior pharynx without erythema, without exudate Neck: No masses, trachea midline. No thyroid enlargement/tenderness/mass appreciated, normal lymph nodes Respiratory: Normal respiratory effort. No  wheeze/rhonchi/rales Cardiovascular: S1/S2 normal, no murmur/rub/gallop auscultated. RRR. No carotid bruit or JVD. No lower extremity edema.   No results found for this or any previous visit (from the past 72 hour(s)).  CXR on personal review Cardiomediastinal silhouette/heart size: normal Obvious bony abnormality: none Infiltrate: none Mass or other opacity: none Atelectasis: none Diaphragms: normal  Lateral view: normal Images were reviewed with the patient. Pt counseled that radiologist will review the images as well, our office will call if the formal read reveals any significant findings other than what has been noted above.   Dg Chest 2 View  07/19/2015  CLINICAL DATA:  Cough and shortness of breath EXAM: CHEST  2 VIEW COMPARISON:  06/16/2014 chest radiograph. FINDINGS: Stable cardiomediastinal silhouette with normal heart size. No pneumothorax. No pleural effusion. There is mild patchy consolidation in the basilar right lower lobe. Otherwise  clear lungs. No pulmonary edema. IMPRESSION: Mild patchy consolidation in the basilar right lower lobe, most suggestive of a developing pneumonia. Recommend follow-up PA and lateral post treatment chest  radiographs in 6-8 weeks. Electronically Signed   By: Delbert Phenix M.D.   On: 07/19/2015 08:05    ASSESSMENT/PLAN:  Cough - Plan: DG Chest 2 View  Viral URI with cough - Plan: ipratropium (ATROVENT) 0.03 % nasal spray, HYDROcodone-homatropine (HYCODAN) 5-1.5 MG/5ML syrup  History of asthma - Plan: albuterol (PROVENTIL HFA;VENTOLIN HFA) 108 (90 Base) MCG/ACT inhaler   ADDENDUM - pt called 07/19/15 to start abx for CAP  Return if symptoms worsen or fail to improve.

## 2015-07-18 NOTE — Patient Instructions (Signed)
Viral Infections °A viral infection can be caused by different types of viruses. Most viral infections are not serious and resolve on their own. However, some infections may cause severe symptoms and may lead to further complications. °SYMPTOMS °Viruses can frequently cause: °· Minor sore throat. °· Aches and pains. °· Headaches. °· Runny nose. °· Different types of rashes. °· Watery eyes. °· Tiredness. °· Cough. °· Loss of appetite. °· Gastrointestinal infections, resulting in nausea, vomiting, and diarrhea. °These symptoms do not respond to antibiotics because the infection is not caused by bacteria. However, you might catch a bacterial infection following the viral infection. This is sometimes called a "superinfection." Symptoms of such a bacterial infection may include: °· Worsening sore throat with pus and difficulty swallowing. °· Swollen neck glands. °· Chills and a high or persistent fever. °· Severe headache. °· Tenderness over the sinuses. °· Persistent overall ill feeling (malaise), muscle aches, and tiredness (fatigue). °· Persistent cough. °· Yellow, green, or brown mucus production with coughing. °HOME CARE INSTRUCTIONS  °· Only take over-the-counter or prescription medicines for pain, discomfort, diarrhea, or fever as directed by your caregiver. °· Drink enough water and fluids to keep your urine clear or pale yellow. Sports drinks can provide valuable electrolytes, sugars, and hydration. °· Get plenty of rest and maintain proper nutrition. Soups and broths with crackers or rice are fine. °SEEK IMMEDIATE MEDICAL CARE IF:  °· You have severe headaches, shortness of breath, chest pain, neck pain, or an unusual rash. °· You have uncontrolled vomiting, diarrhea, or you are unable to keep down fluids. °· You or your child has an oral temperature above 102° F (38.9° C), not controlled by medicine. °· Your baby is older than 3 months with a rectal temperature of 102° F (38.9° C) or higher. °· Your baby is 3  months old or younger with a rectal temperature of 100.4° F (38° C) or higher. °MAKE SURE YOU:  °· Understand these instructions. °· Will watch your condition. °· Will get help right away if you are not doing well or get worse. °  °This information is not intended to replace advice given to you by your health care provider. Make sure you discuss any questions you have with your health care provider. °  °Document Released: 03/05/2005 Document Revised: 08/18/2011 Document Reviewed: 11/01/2014 °Elsevier Interactive Patient Education ©2016 [employer] ° °

## 2015-07-19 MED ORDER — DOXYCYCLINE HYCLATE 100 MG PO TABS: 100.0000 mg | ORAL_TABLET | Freq: Two times a day (BID) | ORAL | Status: DC

## 2015-08-03 ENCOUNTER — Ambulatory Visit: Payer: BLUE CROSS/BLUE SHIELD | Admitting: Sports Medicine

## 2015-08-22 ENCOUNTER — Other Ambulatory Visit: Payer: Self-pay | Admitting: Osteopathic Medicine

## 2015-09-12 ENCOUNTER — Other Ambulatory Visit: Payer: Self-pay | Admitting: Sports Medicine

## 2015-09-20 ENCOUNTER — Ambulatory Visit: Payer: BLUE CROSS/BLUE SHIELD | Admitting: Sports Medicine

## 2015-09-26 ENCOUNTER — Other Ambulatory Visit: Payer: Self-pay | Admitting: Osteopathic Medicine

## 2015-11-01 ENCOUNTER — Other Ambulatory Visit: Payer: Self-pay | Admitting: Osteopathic Medicine

## 2015-11-07 ENCOUNTER — Other Ambulatory Visit: Payer: Self-pay | Admitting: Sports Medicine

## 2016-01-04 ENCOUNTER — Other Ambulatory Visit: Payer: Self-pay | Admitting: Sports Medicine

## 2016-01-23 DIAGNOSIS — I951 Orthostatic hypotension: Secondary | ICD-10-CM | POA: Diagnosis not present

## 2016-01-23 DIAGNOSIS — G4733 Obstructive sleep apnea (adult) (pediatric): Secondary | ICD-10-CM | POA: Diagnosis not present

## 2016-01-29 DIAGNOSIS — G4733 Obstructive sleep apnea (adult) (pediatric): Secondary | ICD-10-CM | POA: Diagnosis not present

## 2016-01-29 DIAGNOSIS — I480 Paroxysmal atrial fibrillation: Secondary | ICD-10-CM | POA: Diagnosis not present

## 2016-02-29 ENCOUNTER — Ambulatory Visit: Payer: BLUE CROSS/BLUE SHIELD | Admitting: Sports Medicine

## 2016-02-29 DIAGNOSIS — L600 Ingrowing nail: Secondary | ICD-10-CM | POA: Diagnosis not present

## 2016-02-29 DIAGNOSIS — L03031 Cellulitis of right toe: Secondary | ICD-10-CM | POA: Diagnosis not present

## 2016-02-29 DIAGNOSIS — L602 Onychogryphosis: Secondary | ICD-10-CM | POA: Diagnosis not present

## 2016-03-04 ENCOUNTER — Other Ambulatory Visit: Payer: Self-pay | Admitting: Sports Medicine

## 2016-03-07 ENCOUNTER — Telehealth: Payer: Self-pay | Admitting: Sports Medicine

## 2016-03-07 ENCOUNTER — Encounter: Payer: Self-pay | Admitting: Sports Medicine

## 2016-03-07 ENCOUNTER — Ambulatory Visit (INDEPENDENT_AMBULATORY_CARE_PROVIDER_SITE_OTHER): Payer: BLUE CROSS/BLUE SHIELD | Admitting: Sports Medicine

## 2016-03-07 VITALS — BP 138/88 | HR 59 | Resp 18 | Ht 70.0 in | Wt 230.9 lb

## 2016-03-07 DIAGNOSIS — E291 Testicular hypofunction: Secondary | ICD-10-CM | POA: Diagnosis not present

## 2016-03-07 DIAGNOSIS — G629 Polyneuropathy, unspecified: Secondary | ICD-10-CM | POA: Diagnosis not present

## 2016-03-07 DIAGNOSIS — Z23 Encounter for immunization: Secondary | ICD-10-CM | POA: Diagnosis not present

## 2016-03-07 DIAGNOSIS — M1A0690 Idiopathic chronic gout, unspecified knee, without tophus (tophi): Secondary | ICD-10-CM

## 2016-03-07 DIAGNOSIS — Z0000 Encounter for general adult medical examination without abnormal findings: Secondary | ICD-10-CM

## 2016-03-07 LAB — COMPREHENSIVE METABOLIC PANEL
ALT: 29 U/L (ref 9–46)
AST: 21 U/L (ref 10–35)
Albumin: 4.5 g/dL (ref 3.6–5.1)
Alkaline Phosphatase: 52 U/L (ref 40–115)
BUN: 25 mg/dL (ref 7–25)
CO2: 24 mmol/L (ref 20–31)
Calcium: 8.9 mg/dL (ref 8.6–10.3)
Chloride: 105 mmol/L (ref 98–110)
Creat: 0.93 mg/dL (ref 0.70–1.33)
Glucose, Bld: 119 mg/dL — ABNORMAL HIGH (ref 65–99)
Potassium: 4.3 mmol/L (ref 3.5–5.3)
Sodium: 140 mmol/L (ref 135–146)
Total Bilirubin: 1.1 mg/dL (ref 0.2–1.2)
Total Protein: 6.7 g/dL (ref 6.1–8.1)

## 2016-03-07 LAB — TESTOSTERONE: Testosterone: 217 ng/dL — ABNORMAL LOW (ref 250–827)

## 2016-03-07 LAB — CBC
HCT: 41.9 % (ref 38.5–50.0)
Hemoglobin: 14.7 g/dL (ref 13.2–17.1)
MCH: 31.6 pg (ref 27.0–33.0)
MCHC: 35.1 g/dL (ref 32.0–36.0)
MCV: 90.1 fL (ref 80.0–100.0)
MPV: 10.7 fL (ref 7.5–12.5)
Platelets: 141 10*3/uL (ref 140–400)
RBC: 4.65 MIL/uL (ref 4.20–5.80)
RDW: 13.5 % (ref 11.0–15.0)
WBC: 5.5 10*3/uL (ref 3.8–10.8)

## 2016-03-07 LAB — LIPID PANEL
Cholesterol: 154 mg/dL (ref 125–200)
HDL: 32 mg/dL — ABNORMAL LOW (ref >=40)
LDL Cholesterol: 105 mg/dL (ref <130)
Total CHOL/HDL Ratio: 4.8 Ratio (ref <=5.0)
Triglycerides: 87 mg/dL (ref <150)
VLDL: 17 mg/dL (ref <30)

## 2016-03-07 LAB — TSH: TSH: 1.88 mIU/L (ref 0.40–4.50)

## 2016-03-07 LAB — URIC ACID: Uric Acid, Serum: 3.7 mg/dL — ABNORMAL LOW (ref 4.0–8.0)

## 2016-03-07 NOTE — Addendum Note (Signed)
Addended by: Pixie CasinoUNNINGHAM, RHONDA C on: 03/07/2016 03:44 PM   Modules accepted: Orders

## 2016-03-07 NOTE — Assessment & Plan Note (Signed)
Annual physical as above. Checking routine blood work. Adding the ColoGuard test. Tetanus vaccine.

## 2016-03-07 NOTE — Assessment & Plan Note (Signed)
Rechecking testosterone levels. 

## 2016-03-07 NOTE — Assessment & Plan Note (Signed)
Rechecking uric acid levels. 

## 2016-03-07 NOTE — Progress Notes (Signed)
  Subjective:    CC: Complete physical exam  HPI:  This is a pleasant 55 year old male here for his physical. He really has no complaints. Is due for a tetanus shot, as well as colon cancer screening.  Past medical history:  Negative.  See flowsheet/record as well for more information.  Surgical history: Negative.  See flowsheet/record as well for more information.  Family history: Negative.  See flowsheet/record as well for more information.  Social history: Negative.  See flowsheet/record as well for more information.  Allergies, and medications have been entered into the medical record, reviewed, and no changes needed.    Review of Systems: No headache, visual changes, nausea, vomiting, diarrhea, constipation, dizziness, abdominal pain, skin rash, fevers, chills, night sweats, swollen lymph nodes, weight loss, chest pain, body aches, joint swelling, muscle aches, shortness of breath, mood changes, visual or auditory hallucinations.  Objective:    General: Well Developed, well nourished, and in no acute distress.  Neuro: Alert and oriented x3, extra-ocular muscles intact, sensation grossly intact. Cranial nerves II through XII are intact, motor, sensory, and coordinative functions are all intact. HEENT: Normocephalic, atraumatic, pupils equal round reactive to light, neck supple, no masses, no lymphadenopathy, thyroid nonpalpable. Oropharynx, nasopharynx, external ear canals are unremarkable. Skin: Warm and dry, no rashes noted.  Cardiac: Regular rate and rhythm, no murmurs rubs or gallops.  Respiratory: Clear to auscultation bilaterally. Not using accessory muscles, speaking in full sentences.  Abdominal: Soft, nontender, nondistended, positive bowel sounds, no masses, no organomegaly.  Musculoskeletal: Shoulder, elbow, wrist, hip, knee, ankle stable, and with full range of motion. Rectal: Good tone, smooth prostate, Hemoccult negative.  Impression and Recommendations:    The patient  was counselled, risk factors were discussed, anticipatory guidance given.  Annual physical exam Annual physical as above. Checking routine blood work. Adding the ColoGuard test. Tetanus vaccine.   Hypogonadism male Rechecking testosterone levels  Gout Rechecking uric acid levels

## 2016-03-07 NOTE — Telephone Encounter (Signed)
Patient was just seen this morning and adv that the medicine he needs called into [facility] in [place] [facility]ro[facility]. Thanks

## 2016-03-08 LAB — HIV ANTIBODY (ROUTINE TESTING W REFLEX): HIV 1&2 Ab, 4th Generation: NONREACTIVE

## 2016-03-08 LAB — HEPATITIS C ANTIBODY: HCV Ab: NEGATIVE

## 2016-03-08 LAB — HEMOGLOBIN A1C
Hgb A1c MFr Bld: 5.8 % — ABNORMAL HIGH (ref <5.7)
Mean Plasma Glucose: 120 mg/dL

## 2016-03-08 LAB — VITAMIN D 25 HYDROXY (VIT D DEFICIENCY, FRACTURES): Vit D, 25-Hydroxy: 39 ng/mL (ref 30–100)

## 2016-03-10 DIAGNOSIS — I739 Peripheral vascular disease, unspecified: Secondary | ICD-10-CM | POA: Diagnosis not present

## 2016-03-16 DIAGNOSIS — Z1212 Encounter for screening for malignant neoplasm of rectum: Secondary | ICD-10-CM | POA: Diagnosis not present

## 2016-03-16 DIAGNOSIS — Z1211 Encounter for screening for malignant neoplasm of colon: Secondary | ICD-10-CM | POA: Diagnosis not present

## 2016-03-18 LAB — COLOGUARD: Cologuard: NEGATIVE

## 2016-04-25 DIAGNOSIS — L03031 Cellulitis of right toe: Secondary | ICD-10-CM | POA: Diagnosis not present

## 2016-04-25 DIAGNOSIS — L602 Onychogryphosis: Secondary | ICD-10-CM | POA: Diagnosis not present

## 2016-04-25 DIAGNOSIS — L600 Ingrowing nail: Secondary | ICD-10-CM | POA: Diagnosis not present

## 2016-05-04 ENCOUNTER — Other Ambulatory Visit: Payer: Self-pay | Admitting: Sports Medicine

## 2016-05-20 ENCOUNTER — Other Ambulatory Visit: Payer: Self-pay | Admitting: Sports Medicine

## 2016-05-23 DIAGNOSIS — L602 Onychogryphosis: Secondary | ICD-10-CM | POA: Diagnosis not present

## 2016-05-23 DIAGNOSIS — L600 Ingrowing nail: Secondary | ICD-10-CM | POA: Diagnosis not present

## 2016-05-23 DIAGNOSIS — L03032 Cellulitis of left toe: Secondary | ICD-10-CM | POA: Diagnosis not present

## 2016-06-20 DIAGNOSIS — B070 Plantar wart: Secondary | ICD-10-CM | POA: Diagnosis not present

## 2016-06-20 DIAGNOSIS — M7741 Metatarsalgia, right foot: Secondary | ICD-10-CM | POA: Diagnosis not present

## 2016-07-01 ENCOUNTER — Other Ambulatory Visit: Payer: Self-pay | Admitting: Sports Medicine

## 2016-07-15 DIAGNOSIS — L03032 Cellulitis of left toe: Secondary | ICD-10-CM | POA: Diagnosis not present

## 2016-07-15 DIAGNOSIS — B070 Plantar wart: Secondary | ICD-10-CM | POA: Diagnosis not present

## 2016-07-23 DIAGNOSIS — L03032 Cellulitis of left toe: Secondary | ICD-10-CM | POA: Diagnosis not present

## 2016-08-04 DIAGNOSIS — G4733 Obstructive sleep apnea (adult) (pediatric): Secondary | ICD-10-CM | POA: Diagnosis not present

## 2016-08-04 DIAGNOSIS — I951 Orthostatic hypotension: Secondary | ICD-10-CM | POA: Diagnosis not present

## 2016-08-06 DIAGNOSIS — G4733 Obstructive sleep apnea (adult) (pediatric): Secondary | ICD-10-CM | POA: Diagnosis not present

## 2016-08-06 DIAGNOSIS — Z6832 Body mass index (BMI) 32.0-32.9, adult: Secondary | ICD-10-CM | POA: Diagnosis not present

## 2016-09-02 ENCOUNTER — Other Ambulatory Visit: Payer: Self-pay | Admitting: Sports Medicine

## 2016-09-19 ENCOUNTER — Ambulatory Visit (INDEPENDENT_AMBULATORY_CARE_PROVIDER_SITE_OTHER): Payer: BLUE CROSS/BLUE SHIELD | Admitting: Pulmonary Disease

## 2016-09-19 ENCOUNTER — Encounter: Payer: Self-pay | Admitting: Pulmonary Disease

## 2016-09-19 ENCOUNTER — Ambulatory Visit (INDEPENDENT_AMBULATORY_CARE_PROVIDER_SITE_OTHER)
Admission: RE | Admit: 2016-09-19 | Discharge: 2016-09-19 | Disposition: A | Discharge status: Home / Self Care | Payer: BLUE CROSS/BLUE SHIELD | Source: Ambulatory Visit | Attending: Pulmonary Disease | Admitting: Pulmonary Disease

## 2016-09-19 VITALS — BP 118/76 | HR 75 | Ht 70.0 in | Wt 228.0 lb

## 2016-09-19 DIAGNOSIS — G4733 Obstructive sleep apnea (adult) (pediatric): Secondary | ICD-10-CM | POA: Insufficient documentation

## 2016-09-19 DIAGNOSIS — R05 Cough: Secondary | ICD-10-CM | POA: Diagnosis not present

## 2016-09-19 DIAGNOSIS — J4520 Mild intermittent asthma, uncomplicated: Secondary | ICD-10-CM

## 2016-09-19 DIAGNOSIS — J45909 Unspecified asthma, uncomplicated: Secondary | ICD-10-CM

## 2016-09-19 DIAGNOSIS — Z9989 Dependence on other enabling machines and devices: Secondary | ICD-10-CM | POA: Diagnosis not present

## 2016-09-19 NOTE — Assessment & Plan Note (Addendum)
Obtain sleep study and download report from [facility] and based on this will make adjustments to your CPAP machine  Weight loss encouraged, compliance with goal of at least 4-6 hrs every night is the expectation. Advised against medications with sedative side effects Cautioned against driving when sleepy - understanding that sleepiness will vary on a day to day basis

## 2016-09-19 NOTE — Assessment & Plan Note (Signed)
Refills on pro-air and Atrovent nasal spray Okay to use Zyrtec 10 mg during allergy season Lung function appears normal

## 2016-09-19 NOTE — Progress Notes (Signed)
Subjective:    Patient ID: William Ortega, male    DOB: 20-Jan-1961, 56 y.o.   MRN: [id number]  HPI  Chief Complaint  Patient presents with  . Sleep Consult    Has OSA and uses CPAP. Wants [place] to monitor his OSA. Gets supplies through [facility].     56 year old never smoker with atrial fibrillation presents to establish care for OSA and asthma  He reports asthma since childhood triggers being allergies, painted diastolic. He grew out of this in his 27s but then this recurred in his late 47s. He uses albuterol MDI post often during the pollen season and uses Atrovent nasal spray which seems to relieve his nasal congestion. He has tried other nasal inhaler such as Nasonex and Flonase without much relief. He appears goats in his yard and feeds them with hay and wonders if this makes his breathing worse Chest x-ray 07/2015 shows patchy consolidation in the right lower lobe. I do not see a follow-up film ear.  OSA was diagnosed a few years ago by an overnight polysomnogram done at [facility]. I'm not able to track down the sleep study. He was initiated on CPAP at 8 cm with a full face mask, when snoring persisted CPAP was increased to 10 cm. He does report feeling sleepy on occasion especially when driving but has not had any accidents. Bedtime is around 9 PM, sleep latency is minimal, he sleeps on his side with 2 pillows, reports 2-3 nocturnal awakenings and is out of bed at 4:30 AM with occasional dryness of mouth but feels rested he is gained about 20 pounds over the last 2 years  He does get his supplies on time, supplier is [facility]. He received a new CPAP about a year ago   There is no history suggestive of cataplexy, sleep paralysis or parasomnias   Spirometry showed no evidence of airway obstruction with FEV1 of 85%   Past Medical History:  Diagnosis Date  . Allergy   . Atrial fibrillation (HCC)   . Bulging lumbar disc   . OSA (obstructive sleep apnea)      Past Surgical History:    Procedure Laterality Date  . KNEE ARTHROPLASTY    . NASAL SEPTUM SURGERY    . VASECTOMY       Allergies  Allergen Reactions  . Penicillins      Social History   Social History  . Marital status: Single    Spouse name: N/A  . Number of children: N/A  . Years of education: N/A   Occupational History  . Not on file.   Social History Main Topics  . Smoking status: Never Smoker  . Smokeless tobacco: Never Used  . Alcohol use No  . Drug use: No  . Sexual activity: Yes   Other Topics Concern  . Not on file   Social History Narrative  . No narrative on file     Family History  Problem Relation Age of Onset  . Heart disease Mother   . Stroke Paternal Grandmother      Review of Systems Constitutional: negative for anorexia, fevers and sweats  Eyes: negative for irritation, redness and visual disturbance  Ears, nose, mouth, throat, and face: negative for earaches, epistaxis, nasal congestion and sore throat  Respiratory: negative for cough, dyspnea on exertion, sputum and wheezing  Cardiovascular: negative for chest pain, dyspnea, lower extremity edema, orthopnea, palpitations and syncope  Gastrointestinal: negative for abdominal pain, constipation, diarrhea, melena, nausea and vomiting  Genitourinary:negative for dysuria, frequency and hematuria  Hematologic/lymphatic: negative for bleeding, easy bruising and lymphadenopathy  Musculoskeletal:negative for arthralgias, muscle weakness and stiff joints  Neurological: negative for coordination problems, gait problems, headaches and weakness  Endocrine: negative for diabetic symptoms including polydipsia, polyuria and weight loss     Objective:   Physical Exam  Gen. Pleasant, obese, in no distress, normal affect ENT - no lesions, no post nasal drip, class 2 airway Neck: No JVD, no thyromegaly, no carotid bruits Lungs: no use of accessory muscles, no dullness to percussion, decreased without rales or rhonchi   Cardiovascular: Rhythm regular, heart sounds  normal, no murmurs or gallops, no peripheral edema Abdomen: soft and non-tender, no hepatosplenomegaly, BS normal. Musculoskeletal: No deformities, no cyanosis or clubbing Neuro:  alert, non focal, no tremors       Assessment & Plan:

## 2016-09-19 NOTE — Patient Instructions (Addendum)
Refills on pro-air and Atrovent nasal spray Okay to use Zyrtec 10 mg during allergy season Lung function appears normal  Obtain sleep study and download report from [facility] and based on this will make adjustments to your CPAP machine

## 2016-09-26 ENCOUNTER — Telehealth: Payer: Self-pay | Admitting: Pulmonary Disease

## 2016-09-26 NOTE — Telephone Encounter (Signed)
  Notes recorded by Oretha Milch, MD on 09/22/2016 at 1:10 PM EDT No evidence of pneumonia --------- lmtcb X1 to relay cxr results.

## 2016-09-26 NOTE — Telephone Encounter (Signed)
Patient returning call - he can be reached at [phone] -pr

## 2016-09-26 NOTE — Telephone Encounter (Signed)
Spoke with pt, aware of results/recs.  Nothing further needed.  

## 2016-10-23 ENCOUNTER — Ambulatory Visit (INDEPENDENT_AMBULATORY_CARE_PROVIDER_SITE_OTHER): Payer: BLUE CROSS/BLUE SHIELD | Admitting: Sports Medicine

## 2016-10-23 DIAGNOSIS — L308 Other specified dermatitis: Secondary | ICD-10-CM

## 2016-10-23 DIAGNOSIS — L309 Dermatitis, unspecified: Secondary | ICD-10-CM | POA: Insufficient documentation

## 2016-10-23 MED ORDER — TRIAMCINOLONE ACETONIDE 0.5 % EX OINT: 1.0000 "application " | TOPICAL_OINTMENT | Freq: Two times a day (BID) | CUTANEOUS | 3 refills | Status: DC

## 2016-10-23 NOTE — Progress Notes (Signed)
  Subjective:    CC: Skin rash  HPI: This is a pleasant 56 year old male, for years he's had an on and off rash on the dorsum of his hands and fingers, pruritic, a dermatologist in the past prescribed betamethasone which seemed to work well. This is now expired and he would like to refill it something else, he has no lesions on his elbows or his knees. Does have some family members with psoriasis.  Past medical history:  Negative.  See flowsheet/record as well for more information.  Surgical history: Negative.  See flowsheet/record as well for more information.  Family history: Negative.  See flowsheet/record as well for more information.  Social history: Negative.  See flowsheet/record as well for more information.  Allergies, and medications have been entered into the medical record, reviewed, and no changes needed.   Review of Systems: No fevers, chills, night sweats, weight loss, chest pain, or shortness of breath.   Objective:    General: Well Developed, well nourished, and in no acute distress.  Neuro: Alert and oriented x3, extra-ocular muscles intact, sensation grossly intact.  HEENT: Normocephalic, atraumatic, pupils equal round reactive to light, neck supple, no masses, no lymphadenopathy, thyroid nonpalpable.  Skin: Warm and dry, There is a scaly, dry rash over the knuckles and fingers, appears somewhat like an eczematous dermatitis. Doesn't really have the features of psoriasis. Cardiac: Regular rate and rhythm, no murmurs rubs or gallops, no lower extremity edema.  Respiratory: Clear to auscultation bilaterally. Not using accessory muscles, speaking in full sentences.  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 Was using betamethasone, switching to triamcinolone ointment. Next line return as needed. If this does get severe he can apply to his hands and occlude with nitrile gloves overnight.  I spent  25 minutes with this patient, greater than 50% was face-to-face time counseling regarding the above diagnoses

## 2016-10-23 NOTE — Assessment & Plan Note (Signed)
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 Was using betamethasone, switching to triamcinolone ointment. Next line return as needed. If this does get severe he can apply to his hands and occlude with nitrile gloves overnight.

## 2016-10-29 ENCOUNTER — Other Ambulatory Visit: Payer: Self-pay

## 2016-10-29 MED ORDER — DICLOFENAC SODIUM 75 MG PO TBEC: 75.0000 mg | DELAYED_RELEASE_TABLET | Freq: Two times a day (BID) | ORAL | 1 refills | Status: DC

## 2016-11-10 DIAGNOSIS — I951 Orthostatic hypotension: Secondary | ICD-10-CM | POA: Diagnosis not present

## 2016-11-10 DIAGNOSIS — G4733 Obstructive sleep apnea (adult) (pediatric): Secondary | ICD-10-CM | POA: Diagnosis not present

## 2016-12-08 ENCOUNTER — Encounter: Payer: Self-pay | Admitting: Sports Medicine

## 2016-12-08 ENCOUNTER — Ambulatory Visit (INDEPENDENT_AMBULATORY_CARE_PROVIDER_SITE_OTHER): Payer: BLUE CROSS/BLUE SHIELD

## 2016-12-08 ENCOUNTER — Ambulatory Visit (INDEPENDENT_AMBULATORY_CARE_PROVIDER_SITE_OTHER): Payer: BLUE CROSS/BLUE SHIELD | Admitting: Sports Medicine

## 2016-12-08 DIAGNOSIS — M25532 Pain in left wrist: Secondary | ICD-10-CM | POA: Diagnosis not present

## 2016-12-08 DIAGNOSIS — S6992XA Unspecified injury of left wrist, hand and finger(s), initial encounter: Secondary | ICD-10-CM

## 2016-12-08 DIAGNOSIS — M795 Residual foreign body in soft tissue: Secondary | ICD-10-CM

## 2016-12-08 NOTE — Progress Notes (Signed)
  Subjective:    CC: Left wrist pain  HPI: Couple weeks ago this pleasant 56 year old male had a wrench bang him on the ulnar aspect of his left wrist, he had some pain and numbness which has all resolved. At this point he really has no symptoms but was curious as to whether anything structural happened.  Past medical history:  Negative.  See flowsheet/record as well for more information.  Surgical history: Negative.  See flowsheet/record as well for more information.  Family history: Negative.  See flowsheet/record as well for more information.  Social history: Negative.  See flowsheet/record as well for more information.  Allergies, and medications have been entered into the medical record, reviewed, and no changes needed.   Review of Systems: No fevers, chills, night sweats, weight loss, chest pain, or shortness of breath.   Objective:    General: Well Developed, well nourished, and in no acute distress.  Neuro: Alert and oriented x3, extra-ocular muscles intact, sensation grossly intact.  HEENT: Normocephalic, atraumatic, pupils equal round reactive to light, neck supple, no masses, no lymphadenopathy, thyroid nonpalpable.  Skin: Warm and dry, no rashes. Cardiac: Regular rate and rhythm, no murmurs rubs or gallops, no lower extremity edema.  Respiratory: Clear to auscultation bilaterally. Not using accessory muscles, speaking in full sentences. Left Wrist: Inspection normal with no visible erythema or swelling. ROM smooth and normal with good flexion and extension and ulnar/radial deviation that is symmetrical with opposite wrist. Palpation is normal over metacarpals, navicular, lunate, and TFCC; tendons without tenderness/ swelling No snuffbox tenderness. No tenderness over Canal of [place]. Strength 5/5 in all directions without pain. Negative tinel's and phalens signs. Negative Finkelstein sign. Negative Watson's test.  There is a small radiopaque piece of metal in his first  webspace in the palm but he has no pain here clinically.  Impression and Recommendations:    Left wrist injury Was hit by a wrench over the ulna 2 weeks ago, pain is actually resolved and exam is benign. This is likely simply a contusion. Strapped the wrist with compressive dressing, we are going to get an x-ray for baseline, and he can return as needed for this.

## 2016-12-08 NOTE — Assessment & Plan Note (Signed)
Was hit by a wrench over the ulna 2 weeks ago, pain is actually resolved and exam is benign. This is likely simply a contusion. Strapped the wrist with compressive dressing, we are going to get an x-ray for baseline, and he can return as needed for this.

## 2017-01-05 ENCOUNTER — Ambulatory Visit: Payer: BLUE CROSS/BLUE SHIELD | Admitting: Pulmonary Disease

## 2017-03-10 DIAGNOSIS — I951 Orthostatic hypotension: Secondary | ICD-10-CM | POA: Diagnosis not present

## 2017-03-10 DIAGNOSIS — G4733 Obstructive sleep apnea (adult) (pediatric): Secondary | ICD-10-CM | POA: Diagnosis not present

## 2017-03-23 DIAGNOSIS — R339 Retention of urine, unspecified: Secondary | ICD-10-CM | POA: Diagnosis not present

## 2017-03-23 DIAGNOSIS — R3915 Urgency of urination: Secondary | ICD-10-CM | POA: Diagnosis not present

## 2017-03-23 DIAGNOSIS — R828 Abnormal findings on cytological and histological examination of urine: Secondary | ICD-10-CM | POA: Diagnosis not present

## 2017-03-23 DIAGNOSIS — E291 Testicular hypofunction: Secondary | ICD-10-CM | POA: Diagnosis not present

## 2017-03-23 DIAGNOSIS — N411 Chronic prostatitis: Secondary | ICD-10-CM | POA: Diagnosis not present

## 2017-03-23 DIAGNOSIS — R3911 Hesitancy of micturition: Secondary | ICD-10-CM | POA: Diagnosis not present

## 2017-04-01 DIAGNOSIS — R339 Retention of urine, unspecified: Secondary | ICD-10-CM | POA: Diagnosis not present

## 2017-04-16 DIAGNOSIS — B070 Plantar wart: Secondary | ICD-10-CM | POA: Diagnosis not present

## 2017-04-21 DIAGNOSIS — E291 Testicular hypofunction: Secondary | ICD-10-CM | POA: Diagnosis not present

## 2017-04-21 DIAGNOSIS — R339 Retention of urine, unspecified: Secondary | ICD-10-CM | POA: Diagnosis not present

## 2017-04-21 DIAGNOSIS — M109 Gout, unspecified: Secondary | ICD-10-CM | POA: Diagnosis not present

## 2017-04-21 DIAGNOSIS — R828 Abnormal findings on cytological and histological examination of urine: Secondary | ICD-10-CM | POA: Diagnosis not present

## 2017-04-21 DIAGNOSIS — R3911 Hesitancy of micturition: Secondary | ICD-10-CM | POA: Diagnosis not present

## 2017-04-21 DIAGNOSIS — R3915 Urgency of urination: Secondary | ICD-10-CM | POA: Diagnosis not present

## 2017-04-21 DIAGNOSIS — J4520 Mild intermittent asthma, uncomplicated: Secondary | ICD-10-CM | POA: Diagnosis not present

## 2017-04-21 DIAGNOSIS — I480 Paroxysmal atrial fibrillation: Secondary | ICD-10-CM | POA: Diagnosis not present

## 2017-04-21 DIAGNOSIS — G4733 Obstructive sleep apnea (adult) (pediatric): Secondary | ICD-10-CM | POA: Diagnosis not present

## 2017-04-21 DIAGNOSIS — N4340 Spermatocele of epididymis, unspecified: Secondary | ICD-10-CM | POA: Diagnosis not present

## 2017-04-21 DIAGNOSIS — Z7982 Long term (current) use of aspirin: Secondary | ICD-10-CM | POA: Diagnosis not present

## 2017-05-04 ENCOUNTER — Other Ambulatory Visit: Payer: Self-pay

## 2017-05-04 MED ORDER — DICLOFENAC SODIUM 75 MG PO TBEC: 75.0000 mg | DELAYED_RELEASE_TABLET | Freq: Two times a day (BID) | ORAL | 1 refills | Status: DC

## 2017-05-07 DIAGNOSIS — L600 Ingrowing nail: Secondary | ICD-10-CM | POA: Diagnosis not present

## 2017-05-07 DIAGNOSIS — L03032 Cellulitis of left toe: Secondary | ICD-10-CM | POA: Diagnosis not present

## 2017-05-12 DIAGNOSIS — B070 Plantar wart: Secondary | ICD-10-CM | POA: Diagnosis not present

## 2017-05-21 DIAGNOSIS — L600 Ingrowing nail: Secondary | ICD-10-CM | POA: Diagnosis not present

## 2017-05-28 ENCOUNTER — Other Ambulatory Visit: Payer: Self-pay | Admitting: Sports Medicine

## 2017-06-22 DIAGNOSIS — G4733 Obstructive sleep apnea (adult) (pediatric): Secondary | ICD-10-CM | POA: Diagnosis not present

## 2017-06-22 DIAGNOSIS — I951 Orthostatic hypotension: Secondary | ICD-10-CM | POA: Diagnosis not present

## 2017-06-30 DIAGNOSIS — G4733 Obstructive sleep apnea (adult) (pediatric): Secondary | ICD-10-CM | POA: Diagnosis not present

## 2017-06-30 DIAGNOSIS — I480 Paroxysmal atrial fibrillation: Secondary | ICD-10-CM | POA: Diagnosis not present

## 2017-06-30 DIAGNOSIS — Z9989 Dependence on other enabling machines and devices: Secondary | ICD-10-CM | POA: Diagnosis not present

## 2017-07-02 DIAGNOSIS — I499 Cardiac arrhythmia, unspecified: Secondary | ICD-10-CM | POA: Diagnosis not present

## 2017-07-27 DIAGNOSIS — R3915 Urgency of urination: Secondary | ICD-10-CM | POA: Diagnosis not present

## 2017-07-27 DIAGNOSIS — E291 Testicular hypofunction: Secondary | ICD-10-CM | POA: Diagnosis not present

## 2017-07-27 DIAGNOSIS — K760 Fatty (change of) liver, not elsewhere classified: Secondary | ICD-10-CM | POA: Diagnosis not present

## 2017-08-28 DIAGNOSIS — E291 Testicular hypofunction: Secondary | ICD-10-CM | POA: Diagnosis not present

## 2017-10-01 DIAGNOSIS — I951 Orthostatic hypotension: Secondary | ICD-10-CM | POA: Diagnosis not present

## 2017-10-01 DIAGNOSIS — G4733 Obstructive sleep apnea (adult) (pediatric): Secondary | ICD-10-CM | POA: Diagnosis not present

## 2017-10-09 ENCOUNTER — Ambulatory Visit (INDEPENDENT_AMBULATORY_CARE_PROVIDER_SITE_OTHER): Payer: BLUE CROSS/BLUE SHIELD

## 2017-10-09 ENCOUNTER — Ambulatory Visit: Payer: BLUE CROSS/BLUE SHIELD | Admitting: Physician Assistant

## 2017-10-09 ENCOUNTER — Encounter: Payer: Self-pay | Admitting: Physician Assistant

## 2017-10-09 VITALS — BP 150/94 | HR 71 | Wt 229.0 lb

## 2017-10-09 DIAGNOSIS — M5116 Intervertebral disc disorders with radiculopathy, lumbar region: Secondary | ICD-10-CM

## 2017-10-09 DIAGNOSIS — M545 Low back pain: Secondary | ICD-10-CM

## 2017-10-09 DIAGNOSIS — M5117 Intervertebral disc disorders with radiculopathy, lumbosacral region: Secondary | ICD-10-CM

## 2017-10-09 DIAGNOSIS — G8929 Other chronic pain: Secondary | ICD-10-CM

## 2017-10-09 DIAGNOSIS — M4316 Spondylolisthesis, lumbar region: Secondary | ICD-10-CM

## 2017-10-09 DIAGNOSIS — G6289 Other specified polyneuropathies: Secondary | ICD-10-CM | POA: Diagnosis not present

## 2017-10-09 MED ORDER — GABAPENTIN 300 MG PO CAPS: ORAL_CAPSULE | ORAL | 3 refills | Status: DC

## 2017-10-09 NOTE — Patient Instructions (Addendum)
X-ray today Continue Diclofenac twice a day Start Gabapentin at bedtime. Follow instructions for self-titration. 1 tab nightly for 3 days, then 1 tab twice a day for 3 days, then 1 tab three times per day

## 2017-10-09 NOTE — Progress Notes (Signed)
HPI:                                                                William Ortega is a 57 y.o. male who presents to [facility]: Primary Care Sports Medicine today for   This is a pleasant 57 yo M with PMH of lumbar DDD who presents with intermittent episodes  of alternating burning foot pain for the last 2 years. He is also having chronic left-sided low back pain radiating into his buttock associated with chronic numbness in his penis. Symptoms have been gradually worsening. Burning involves the entire foot bilaterally and is lasting longer and more severe, most recently today for 20 minutes. Episodes are occurring a couple of times per month. No known triggers, but does seem worse with heat. Describes "shooting nerve pain coming out of my toes." There is also loss of sensation in his big toes bilaterally.   No flowsheet data found.  No flowsheet data found.    Past Medical History:  Diagnosis Date  . Allergy   . Atrial fibrillation (HCC)   . Bulging lumbar disc   . OSA (obstructive sleep apnea)    Past Surgical History:  Procedure Laterality Date  . KNEE ARTHROPLASTY    . NASAL SEPTUM SURGERY    . VASECTOMY     Social History   Tobacco Use  . Smoking status: Never Smoker  . Smokeless tobacco: Never Used  Substance Use Topics  . Alcohol use: No   family history includes Heart disease in his mother; Stroke in his paternal grandmother.    ROS: negative except as noted in the HPI  Medications: Current Outpatient Medications  Medication Sig Dispense Refill  . albuterol (PROVENTIL HFA;VENTOLIN HFA) 108 (90 Base) MCG/ACT inhaler Inhale 2 puffs into the lungs every 6 (six) hours as needed for wheezing. 2 Inhaler 11  . allopurinol (ZYLOPRIM) 300 MG tablet TAKE 1 TABLET BY MOUTH TWICE A DAY 180 tablet 2  . AMBULATORY NON FORMULARY MEDICATION Knee-high, medium compression, graduated compression stockings. Apply to lower extremities. 1 each 0  . diclofenac  (VOLTAREN) 75 MG EC tablet Take 1 tablet (75 mg total) by mouth 2 (two) times daily. 180 tablet 1  . flecainide (TAMBOCOR) 50 MG tablet Take 25 mg by mouth 2 (two) times daily.    Marland Kitchen ipratropium (ATROVENT) 0.03 % nasal spray PLACE 2 SPRAYS INTO BOTH NOSTRILS EVERY 12 (TWELVE) HOURS. 30 mL 0  . metoprolol succinate (TOPROL-XL) 25 MG 24 hr tablet Take 1 tablet (25 mg total) by mouth daily. 30 tablet 2  . triamcinolone ointment (KENALOG) 0.5 % Apply 1 application topically 2 (two) times daily. To affected area, avoid eyes and face 30 g 3  . gabapentin (NEURONTIN) 300 MG capsule 300 mg PO at bedtime for 3 days, then increase to 300 mg bid, then increase to 300 mg tid 90 capsule 3  . Testosterone 20.25 MG/ACT (1.62%) GEL Apply 3 Pump topically every morning.  0   No current facility-administered medications for this visit.    Allergies  Allergen Reactions  . Penicillins        Objective:  BP (!) 150/94   Pulse 71   Wt 229 lb (103.9 kg)   BMI 32.86 kg/m  Gen:  alert, not ill-appearing, no distress, appropriate for age, obese male HEENT: head normocephalic without obvious abnormality, conjunctiva and cornea clear, trachea midline Pulm: Normal work of breathing, normal phonation Neuro: loss of vibratory sensation in bilateral great toes, alert and oriented x 3, no tremor MSK: extremities atraumatic, normal gait and station, distal pulses intact, brisk capillary refill Skin: plantar warts on right foot Psych: well-groomed, cooperative, good eye contact, euthymic mood, affect mood-congruent, speech is articulate, and thought processes clear and goal-directed  Diabetic Foot Exam - Simple   Simple Foot Form Diabetic Foot exam was performed with the following findings:  Yes 10/09/2017  3:37 PM  Visual Inspection See comments:  Yes Sensation Testing See comments:  Yes Pulse Check Posterior Tibialis and Dorsalis pulse intact bilaterally:  Yes Comments Calluses of plantar aspects of both feet  with plantar warts on the right. No other deformities or ulcerations. Intact to touch bilaterally Monofilament intact on left third and fifth toes only and right dorsum       No results found for this or any previous visit (from the past 72 hour(s)). Dg Lumbar Spine Complete  Result Date: 10/12/2017 CLINICAL DATA:  Chronic low back pain with burning in feet and numbness. Sciatica. EXAM: LUMBAR SPINE - COMPLETE 4+ VIEW COMPARISON:  MR lumbar spine 01/04/2004. FINDINGS: There may be very slight anterolisthesis of L5 on S1 with suspected bilateral L5 pars defects. Alignment is otherwise anatomic. Vertebral body height is maintained. Mild anterior endplate degenerative changes from L1-2 to L4-5. Endplate degenerative changes and slight loss of disc space height at L5-S1 with facet hypertrophy. IMPRESSION: 1. Moderate degenerative disc disease at L5-S1, with slight anterolisthesis and suspected bilateral pars defects. 2. Mild degenerative disc disease L1-2 through L4-5. Electronically Signed   By: Leanna Battles M.D.   On: 10/12/2017 08:10   Dg Lumb Spine Flex&ext Only  Result Date: 10/12/2017 CLINICAL DATA:  Chronic low back pain with sciatica. EXAM: LUMBAR SPINE FLEX AND EXTEND ONLY - 2-3 VIEW COMPARISON:  Lumbar spine series same day. FINDINGS: Grade 1 anterolisthesis of L5 on S1, with endplate degenerative changes, slight loss of disc space height, facet hypertrophy and pars defects. No definite pathologic motion. Alignment is otherwise anatomic. Endplate degenerative changes from L1-2 to L4-5 are mild. IMPRESSION: 1. Grade 1 anterolisthesis of L5 on S1 with degenerative disc disease and pars defects. 2. No pathologic motion. 3. Mild degenerative disc disease L1-2 to L4-5. Electronically Signed   By: Leanna Battles M.D.   On: 10/12/2017 08:12   01/07/2004 Clinical Data: Scrotal pain. Back pain.  MRI OF THE LUMBAR SPINE WITHOUT CONTRAST  No comparison.   Normal alignment and no fracture. No mass.  The conus medullaris is normal.   L1-2: Negative.  L2-3: Slight bulging of the disk.   L3-4: Mild disk bulging and disk space narrowing.   L4-5: The disk shows diffuse bulging without focal protrusion. There is mild facet arthropathy and mild spinal stenosis.  L5-S1: There is right foraminal narrowing due to disk protrusion or possibly osteophyte. In addition, there is facet arthropathy, right greater than left, contributing to foraminal narrowing.  IMPRESSION  Mild disk bulging and mild spinal stenosis at L4-5.  Right foraminal encroachment at L5-S1 due to disk protrusion and/or osteophyte.   Provider: Darol Destine  Assessment and Plan: 57 y.o. male with   Chronic left-sided low back pain, with sciatica presence unspecified - Plan: DG Lumb Spine Flex&Ext Only, DG Lumbar Spine Complete  Other polyneuropathy -  Plan: gabapentin (NEURONTIN) 300 MG capsule, DG Lumbar Spine Complete  Worsening polyneuropathy of bilateral lower extremities, likely associated with chronic DDD of the Lspine. No constitutional symptoms, saddle numbness, bowel/bladder dysfunction. He has not had advanced imaging since 2005, and has not recently had any conservative treatment of his back pain. Given the abnormal sensory neurologic findings, X-rays obtained today and recommend follow-up with Sports Medicine for possible MRI. Starting Gabapentin titration. Cont Voltaren bid. Spine rehab exercises.    Patient education and anticipatory guidance given Patient agrees with treatment plan Follow-up as needed if symptoms worsen or fail to improve  Levonne Hubert PA-C

## 2017-10-12 ENCOUNTER — Ambulatory Visit (INDEPENDENT_AMBULATORY_CARE_PROVIDER_SITE_OTHER): Payer: BLUE CROSS/BLUE SHIELD

## 2017-10-12 ENCOUNTER — Ambulatory Visit: Payer: BLUE CROSS/BLUE SHIELD | Admitting: Sports Medicine

## 2017-10-12 DIAGNOSIS — M5136 Other intervertebral disc degeneration, lumbar region: Secondary | ICD-10-CM

## 2017-10-12 DIAGNOSIS — M79672 Pain in left foot: Secondary | ICD-10-CM | POA: Diagnosis not present

## 2017-10-12 DIAGNOSIS — S99922A Unspecified injury of left foot, initial encounter: Secondary | ICD-10-CM | POA: Diagnosis not present

## 2017-10-12 DIAGNOSIS — M25572 Pain in left ankle and joints of left foot: Secondary | ICD-10-CM

## 2017-10-12 DIAGNOSIS — M51369 Other intervertebral disc degeneration, lumbar region without mention of lumbar back pain or lower extremity pain: Secondary | ICD-10-CM | POA: Insufficient documentation

## 2017-10-12 MED ORDER — TRAMADOL HCL 50 MG PO TABS: 50.0000 mg | ORAL_TABLET | Freq: Three times a day (TID) | ORAL | 0 refills | Status: DC | PRN

## 2017-10-12 NOTE — Assessment & Plan Note (Signed)
Formal physical therapy. Continue gabapentin, Voltaren, tramadol for breakthrough pain.

## 2017-10-12 NOTE — Progress Notes (Signed)
Subjective:    CC: Several issues  HPI: Known lumbar degenerative disc disease, having bilateral radicular symptoms, minimal improvement with gabapentin.  Principal issue is pain over the left foot, he localizes the pain over the dorsum at the base of the fourth and fifth metatarsals.  Worse with weightbearing.  Works as a Psychologist, occupational and is carrying large and heavy things, spends a lot of time on his feet.  I reviewed the past medical history, family history, social history, surgical history, and allergies today and no changes were needed.  Please see the problem list section below in epic for further details.  Past Medical History: Past Medical History:  Diagnosis Date  . Allergy   . Atrial fibrillation (HCC)   . Bulging lumbar disc   . OSA (obstructive sleep apnea)    Past Surgical History: Past Surgical History:  Procedure Laterality Date  . KNEE ARTHROPLASTY    . NASAL SEPTUM SURGERY    . VASECTOMY     Social History: Social History   Socioeconomic History  . Marital status: Married    Spouse name: Not on file  . Number of children: Not on file  . Years of education: Not on file  . Highest education level: Not on file  Occupational History  . Not on file  Social Needs  . Financial resource strain: Not on file  . Food insecurity:    Worry: Not on file    Inability: Not on file  . Transportation needs:    Medical: Not on file    Non-medical: Not on file  Tobacco Use  . Smoking status: Never Smoker  . Smokeless tobacco: Never Used  Substance and Sexual Activity  . Alcohol use: No  . Drug use: No  . Sexual activity: Yes  Lifestyle  . Physical activity:    Days per week: Not on file    Minutes per session: Not on file  . Stress: Not on file  Relationships  . Social connections:    Talks on phone: Not on file    Gets together: Not on file    Attends religious service: Not on file    Active member of club or organization: Not on file    Attends meetings of  clubs or organizations: Not on file    Relationship status: Not on file  Other Topics Concern  . Not on file  Social History Narrative  . Not on file   Family History: Family History  Problem Relation Age of Onset  . Heart disease Mother   . Stroke Paternal Grandmother    Allergies: Allergies  Allergen Reactions  . Penicillins    Medications: See med rec.  Review of Systems: No fevers, chills, night sweats, weight loss, chest pain, or shortness of breath.   Objective:    General: Well Developed, well nourished, and in no acute distress.  Neuro: Alert and oriented x3, extra-ocular muscles intact, sensation grossly intact.  HEENT: Normocephalic, atraumatic, pupils equal round reactive to light, neck supple, no masses, no lymphadenopathy, thyroid nonpalpable.  Skin: Warm and dry, no rashes. Cardiac: Regular rate and rhythm, no murmurs rubs or gallops, no lower extremity edema.  Respiratory: Clear to auscultation bilaterally. Not using accessory muscles, speaking in full sentences. Left foot: No visible erythema or swelling. Range of motion is full in all directions. Strength is 5/5 in all directions. No hallux valgus. No pes cavus or pes planus. No abnormal callus noted. No pain over the navicular prominence, or base of  fifth metatarsal. No tenderness to palpation of the calcaneal insertion of plantar fascia. No pain at the Achilles insertion. No pain over the calcaneal bursa. No pain of the retrocalcaneal bursa. Tender to palpation at the dorsal base of the fourth and fifth metatarsals No hallux rigidus or limitus. No tenderness palpation over interphalangeal joints. No pain with compression of the metatarsal heads. Neurovascularly intact distally.  X-rays personally reviewed, there is periosteal reaction over the fourth metatarsal shaft on the AP view.  There is also some arthritis at the tarsometatarsal joint between the cuboid and the fourth and fifth  metatarsals.  Impression and Recommendations:    Left foot pain Pain over the dorsal base of the fifth and fourth metatarsals. Postop shoe, out of work. X-rays. Tramadol for pain. Return in 2 weeks, he will probably have some disability paperwork to fill out.  Lumbar degenerative disc disease Formal physical therapy. Continue gabapentin, Voltaren, tramadol for breakthrough pain. ___________________________________________ William Ortega. Benjamin Stain, M.D., ABFM., CAQSM. Primary Care and Sports Medicine [facility]  Adjunct Instructor of Family Medicine  [employer]

## 2017-10-12 NOTE — Progress Notes (Signed)
Good morning Leeum,  Your x-rays show lumbar degenerative disc disease as we expected. Treatment plan does not change.  Please follow-up with Dr. Veronda Prude, [place]

## 2017-10-12 NOTE — Assessment & Plan Note (Signed)
Pain over the dorsal base of the fifth and fourth metatarsals. Postop shoe, out of work. X-rays. Tramadol for pain. Return in 2 weeks, he will probably have some disability paperwork to fill out.

## 2017-10-28 ENCOUNTER — Ambulatory Visit: Payer: BLUE CROSS/BLUE SHIELD | Admitting: Sports Medicine

## 2017-10-28 ENCOUNTER — Encounter: Payer: Self-pay | Admitting: Sports Medicine

## 2017-10-28 DIAGNOSIS — E291 Testicular hypofunction: Secondary | ICD-10-CM

## 2017-10-28 DIAGNOSIS — M1A0690 Idiopathic chronic gout, unspecified knee, without tophus (tophi): Secondary | ICD-10-CM | POA: Diagnosis not present

## 2017-10-28 DIAGNOSIS — M79672 Pain in left foot: Secondary | ICD-10-CM

## 2017-10-28 DIAGNOSIS — E7800 Pure hypercholesterolemia, unspecified: Secondary | ICD-10-CM

## 2017-10-28 DIAGNOSIS — B070 Plantar wart: Secondary | ICD-10-CM | POA: Diagnosis not present

## 2017-10-28 DIAGNOSIS — E119 Type 2 diabetes mellitus without complications: Secondary | ICD-10-CM

## 2017-10-28 NOTE — Progress Notes (Addendum)
Subjective:    CC: Follow-up  HPI: This is a pleasant 57 year old male with a left fourth metatarsal stress fracture, here for follow-up.  He has been in a boot now for 2 weeks, out of work on short-term disability, doing better but still with significant pain.  Right-sided plantar warts: Previously managed with podiatry, multiple modalities including TCA application, cryotherapy, attempted surgical excision, these continue to recur.  Preventive measures: Due for some routine labs, fasting today.  I reviewed the past medical history, family history, social history, surgical history, and allergies today and no changes were needed.  Please see the problem list section below in epic for further details.  Past Medical History: Past Medical History:  Diagnosis Date  . Allergy   . Atrial fibrillation (HCC)   . Bulging lumbar disc   . OSA (obstructive sleep apnea)    Past Surgical History: Past Surgical History:  Procedure Laterality Date  . KNEE ARTHROPLASTY    . NASAL SEPTUM SURGERY    . VASECTOMY     Social History: Social History   Socioeconomic History  . Marital status: Married    Spouse name: Not on file  . Number of children: Not on file  . Years of education: Not on file  . Highest education level: Not on file  Occupational History  . Not on file  Social Needs  . Financial resource strain: Not on file  . Food insecurity:    Worry: Not on file    Inability: Not on file  . Transportation needs:    Medical: Not on file    Non-medical: Not on file  Tobacco Use  . Smoking status: Never Smoker  . Smokeless tobacco: Never Used  Substance and Sexual Activity  . Alcohol use: No  . Drug use: No  . Sexual activity: Yes  Lifestyle  . Physical activity:    Days per week: Not on file    Minutes per session: Not on file  . Stress: Not on file  Relationships  . Social connections:    Talks on phone: Not on file    Gets together: Not on file    Attends religious  service: Not on file    Active member of club or organization: Not on file    Attends meetings of clubs or organizations: Not on file    Relationship status: Not on file  Other Topics Concern  . Not on file  Social History Narrative  . Not on file   Family History: Family History  Problem Relation Age of Onset  . Heart disease Mother   . Stroke Paternal Grandmother    Allergies: Allergies  Allergen Reactions  . Penicillins    Medications: See med rec.  Review of Systems: No fevers, chills, night sweats, weight loss, chest pain, or shortness of breath.   Objective:    General: Well Developed, well nourished, and in no acute distress.  Neuro: Alert and oriented x3, extra-ocular muscles intact, sensation grossly intact.  HEENT: Normocephalic, atraumatic, pupils equal round reactive to light, neck supple, no masses, no lymphadenopathy, thyroid nonpalpable.  Skin: Warm and dry, no rashes. Cardiac: Regular rate and rhythm, no murmurs rubs or gallops, no lower extremity edema.  Respiratory: Clear to auscultation bilaterally. Not using accessory muscles, speaking in full sentences. Bilateral feet: No visible erythema or swelling. Right side with multiple plantar warts. Range of motion is full in all directions. Strength is 5/5 in all directions. No hallux valgus. No pes cavus or pes  planus. No abnormal callus noted. No pain over the navicular prominence, or base of fifth metatarsal. No tenderness to palpation of the calcaneal insertion of plantar fascia. No pain at the Achilles insertion. No pain over the calcaneal bursa. No pain of the retrocalcaneal bursa. Tenderness to palpation over the left fourth metatarsal shaft dorsally No hallux rigidus or limitus. No tenderness palpation over interphalangeal joints. No pain with compression of the metatarsal heads. Neurovascularly intact distally.  Procedure:  Cryodestruction of #6 plantar warts on the right foot Consent obtained  and verified. Time-out conducted. Noted no overlying erythema, induration, or other signs of local infection. Completed without difficulty using Cryo-Gun. Advised to call if fevers/chills, erythema, induration, drainage, or persistent bleeding.  Impression and Recommendations:    Left foot pain Lightly better after 2 weeks in a boot, I would like him to wear the boot for another solid 4 weeks, return to work date is second week of July. Return to see me first week of July for further evaluation. Suspect fourth metatarsal stress injury.  Plantar wart of right foot Aggressive cryotherapy of #6 plantar warts on the right foot. If persistence we will core them out surgically.  Controlled type 2 diabetes mellitus without complication, without long-term current use of insulin (HCC) Adding XigDuo, at the follow-up visit we will discuss the disease, prognosis and screening measures.  ___________________________________________ William Ortega. Benjamin Stain, M.D., ABFM., CAQSM. Primary Care and Sports Medicine [place] [facility]  Adjunct Instructor of Family Medicine  [employer]

## 2017-10-28 NOTE — Assessment & Plan Note (Signed)
Lightly better after 2 weeks in a boot, I would like him to wear the boot for another solid 4 weeks, return to work date is second week of July. Return to see me first week of July for further evaluation. Suspect fourth metatarsal stress injury.

## 2017-10-28 NOTE — Assessment & Plan Note (Signed)
Aggressive cryotherapy of #6 plantar warts on the right foot. If persistence we will core them out surgically.

## 2017-10-29 ENCOUNTER — Telehealth: Payer: Self-pay | Admitting: Sports Medicine

## 2017-10-29 DIAGNOSIS — E1129 Type 2 diabetes mellitus with other diabetic kidney complication: Secondary | ICD-10-CM | POA: Insufficient documentation

## 2017-10-29 DIAGNOSIS — E119 Type 2 diabetes mellitus without complications: Secondary | ICD-10-CM | POA: Insufficient documentation

## 2017-10-29 MED ORDER — DAPAGLIFLOZIN PRO-METFORMIN ER 10-1000 MG PO TB24: 1.0000 | ORAL_TABLET | Freq: Every day | ORAL | 11 refills | Status: DC

## 2017-10-29 NOTE — Addendum Note (Signed)
Addended by: Monica Becton on: 10/29/2017 08:43 AM   Modules accepted: Orders

## 2017-10-29 NOTE — Assessment & Plan Note (Signed)
Adding XigDuo, at the follow-up visit we will discuss the disease, prognosis and screening measures.

## 2017-10-29 NOTE — Telephone Encounter (Signed)
Approvedtoday  CaseId:[id number];Status:Approved;Review Type:Prior Auth;Coverage Start Date:09/29/2017;Coverage End Date:10/29/2018 Pharmacy notified.

## 2017-10-31 ENCOUNTER — Other Ambulatory Visit: Payer: Self-pay | Admitting: Physician Assistant

## 2017-10-31 DIAGNOSIS — G6289 Other specified polyneuropathies: Secondary | ICD-10-CM

## 2017-10-31 LAB — CBC
HCT: 41.5 % (ref 38.5–50.0)
Hemoglobin: 14.9 g/dL (ref 13.2–17.1)
MCH: 32.6 pg (ref 27.0–33.0)
MCHC: 35.9 g/dL (ref 32.0–36.0)
MCV: 90.8 fL (ref 80.0–100.0)
MPV: 10.7 fL (ref 7.5–12.5)
Platelets: 145 Thousand/uL (ref 140–400)
RBC: 4.57 10*6/uL (ref 4.20–5.80)
RDW: 12.7 % (ref 11.0–15.0)
WBC: 4.9 10*3/uL (ref 3.8–10.8)

## 2017-10-31 LAB — HEMOGLOBIN A1C
Hgb A1c MFr Bld: 6.7 % of total Hgb — ABNORMAL HIGH (ref <5.7)
Mean Plasma Glucose: 146 (calc)
eAG (mmol/L): 8.1 (calc)

## 2017-10-31 LAB — LIPID PANEL W/REFLEX DIRECT LDL
Cholesterol: 159 mg/dL (ref <200)
HDL: 33 mg/dL — ABNORMAL LOW (ref >40)
LDL Cholesterol (Calc): 99 mg/dL
Non-HDL Cholesterol (Calc): 126 mg/dL (calc) (ref <130)
Total CHOL/HDL Ratio: 4.8 (calc) (ref <5.0)
Triglycerides: 170 mg/dL — ABNORMAL HIGH (ref <150)

## 2017-10-31 LAB — COMPREHENSIVE METABOLIC PANEL
ALT: 39 U/L (ref 9–46)
Albumin: 4.3 g/dL (ref 3.6–5.1)
Alkaline phosphatase (APISO): 62 U/L (ref 40–115)
BUN: 23 mg/dL (ref 7–25)
Creat: 1.01 mg/dL (ref 0.70–1.33)
Glucose, Bld: 141 mg/dL — ABNORMAL HIGH (ref 65–99)
Potassium: 4.2 mmol/L (ref 3.5–5.3)
Sodium: 139 mmol/L (ref 135–146)
Total Protein: 6.5 g/dL (ref 6.1–8.1)

## 2017-10-31 LAB — TESTOSTERONE, FREE & TOTAL
Free Testosterone: 43.5 pg/mL (ref 35.0–155.0)
Testosterone, Total, LC-MS-MS: 182 ng/dL — ABNORMAL LOW (ref 250–1100)

## 2017-10-31 LAB — URIC ACID: Uric Acid, Serum: 3.2 mg/dL — ABNORMAL LOW (ref 4.0–8.0)

## 2017-10-31 LAB — COMPREHENSIVE METABOLIC PANEL WITH GFR
AG Ratio: 2 (calc) (ref 1.0–2.5)
AST: 24 U/L (ref 10–35)
CO2: 31 mmol/L (ref 20–32)
Calcium: 9.2 mg/dL (ref 8.6–10.3)
Chloride: 102 mmol/L (ref 98–110)
Globulin: 2.2 g/dL (ref 1.9–3.7)
Total Bilirubin: 1.2 mg/dL (ref 0.2–1.2)

## 2017-10-31 LAB — TSH: TSH: 2.71 mIU/L (ref 0.40–4.50)

## 2017-11-03 NOTE — Addendum Note (Signed)
Addended by: Monica Becton on: 11/03/2017 09:28 AM   Modules accepted: Orders

## 2017-11-03 NOTE — Assessment & Plan Note (Signed)
Testosterone levels are still low, increase to 2 pumps on each side daily. Recheck testosterone levels in 1 month.

## 2017-11-10 ENCOUNTER — Ambulatory Visit (INDEPENDENT_AMBULATORY_CARE_PROVIDER_SITE_OTHER): Payer: BLUE CROSS/BLUE SHIELD | Admitting: Sports Medicine

## 2017-11-10 ENCOUNTER — Encounter: Payer: Self-pay | Admitting: Sports Medicine

## 2017-11-10 VITALS — BP 116/74 | HR 61 | Resp 16 | Wt 226.0 lb

## 2017-11-10 DIAGNOSIS — E119 Type 2 diabetes mellitus without complications: Secondary | ICD-10-CM | POA: Diagnosis not present

## 2017-11-10 DIAGNOSIS — M79672 Pain in left foot: Secondary | ICD-10-CM | POA: Diagnosis not present

## 2017-11-10 DIAGNOSIS — Z23 Encounter for immunization: Secondary | ICD-10-CM | POA: Diagnosis not present

## 2017-11-10 DIAGNOSIS — I480 Paroxysmal atrial fibrillation: Secondary | ICD-10-CM | POA: Diagnosis not present

## 2017-11-10 DIAGNOSIS — B070 Plantar wart: Secondary | ICD-10-CM

## 2017-11-10 DIAGNOSIS — E291 Testicular hypofunction: Secondary | ICD-10-CM | POA: Diagnosis not present

## 2017-11-10 DIAGNOSIS — E7800 Pure hypercholesterolemia, unspecified: Secondary | ICD-10-CM

## 2017-11-10 DIAGNOSIS — Z0000 Encounter for general adult medical examination without abnormal findings: Secondary | ICD-10-CM | POA: Diagnosis not present

## 2017-11-10 DIAGNOSIS — L739 Follicular disorder, unspecified: Secondary | ICD-10-CM | POA: Diagnosis not present

## 2017-11-10 MED ORDER — ASPIRIN EC 81 MG PO TBEC: 81.0000 mg | DELAYED_RELEASE_TABLET | Freq: Every day | ORAL | 3 refills | Status: DC

## 2017-11-10 MED ORDER — DOXYCYCLINE HYCLATE 100 MG PO TABS: 100.0000 mg | ORAL_TABLET | Freq: Two times a day (BID) | ORAL | 0 refills | Status: AC

## 2017-11-10 MED ORDER — TESTOSTERONE CYPIONATE 200 MG/ML IM SOLN: 200.0000 mg | INTRAMUSCULAR | 0 refills | Status: DC

## 2017-11-10 MED ORDER — TESTOSTERONE CYPIONATE 200 MG/ML IM SOLN
200.0000 mg | Freq: Once | INTRAMUSCULAR | Status: AC
  Administered 2017-11-10: 200 mg via INTRAMUSCULAR

## 2017-11-10 NOTE — Assessment & Plan Note (Signed)
With diabetes his chads 2 score is 1, adding aspirin.

## 2017-11-10 NOTE — Assessment & Plan Note (Signed)
Doing well with XigDuo, prescription discount coupon given today. Good weight loss. We can recheck his hemoglobin A1c in 2-1/2 months. He is going to make an appointment with an eye doctor and at the 3-week follow-up visit we can get a urine microalbumin.

## 2017-11-10 NOTE — Assessment & Plan Note (Addendum)
Annual physical as above. Pneumonia 23 vaccine.

## 2017-11-10 NOTE — Assessment & Plan Note (Signed)
Has been about 3 weeks total in the boot, improving considerably, I am going to add another 3 weeks. My suspicion is fourth metatarsal stress injury, this can take 1 to 3 months to heal.

## 2017-11-10 NOTE — Assessment & Plan Note (Signed)
Unable to get testosterone levels up with topical supplementation. Switching to injectable testosterone, first injection today, prescription written, he will return in a nurse visit to learn how to do testosterone shots in 2 weeks, and at the 3-week point from now we will recheck his levels.  I like to see him at that time though. We would need to check CBC, PSA, testosterone.

## 2017-11-10 NOTE — Assessment & Plan Note (Signed)
Elevated triglycerides but we are going to recheck in a few months before treating.

## 2017-11-10 NOTE — Progress Notes (Signed)
Subjective:    CC: Annual physical  HPI:  Diabetes mellitus type 2: Doing well on XigDuo.  Obesity: Good weight loss with XigDuo.  A. fib: Low chads 2 score, currently only on aspirin.  Hypergonadism: Testosterone levels insufficient with high-dose application of topical testosterone, would like to switch to injectable.  Foot pain: Suspected fourth metatarsal stress injury, has only been in the boot 3 to 4 weeks now, noting some improvement.  Plantar warts: Due for repeat cryotherapy but we do not have any liquid nitrogen today.  Preventive measures: Due for pneumococcal vaccine, physical today.  I reviewed the past medical history, family history, social history, surgical history, and allergies today and no changes were needed.  Please see the problem list section below in epic for further details.  Past Medical History: Past Medical History:  Diagnosis Date  . Allergy   . Atrial fibrillation (HCC)   . Bulging lumbar disc   . OSA (obstructive sleep apnea)    Past Surgical History: Past Surgical History:  Procedure Laterality Date  . KNEE ARTHROPLASTY    . NASAL SEPTUM SURGERY    . VASECTOMY     Social History: Social History   Socioeconomic History  . Marital status: Married    Spouse name: Not on file  . Number of children: Not on file  . Years of education: Not on file  . Highest education level: Not on file  Occupational History  . Not on file  Social Needs  . Financial resource strain: Not on file  . Food insecurity:    Worry: Not on file    Inability: Not on file  . Transportation needs:    Medical: Not on file    Non-medical: Not on file  Tobacco Use  . Smoking status: Never Smoker  . Smokeless tobacco: Never Used  Substance and Sexual Activity  . Alcohol use: No  . Drug use: No  . Sexual activity: Yes  Lifestyle  . Physical activity:    Days per week: Not on file    Minutes per session: Not on file  . Stress: Not on file  Relationships  .  Social connections:    Talks on phone: Not on file    Gets together: Not on file    Attends religious service: Not on file    Active member of club or organization: Not on file    Attends meetings of clubs or organizations: Not on file    Relationship status: Not on file  Other Topics Concern  . Not on file  Social History Narrative  . Not on file   Family History: Family History  Problem Relation Age of Onset  . Heart disease Mother   . Stroke Paternal Grandmother    Allergies: Allergies  Allergen Reactions  . Penicillins    Medications: See med rec.  Review of Systems: No headache, visual changes, nausea, vomiting, diarrhea, constipation, dizziness, abdominal pain, skin rash, fevers, chills, night sweats, swollen lymph nodes, weight loss, chest pain, body aches, joint swelling, muscle aches, shortness of breath, mood changes, visual or auditory hallucinations.  Objective:    General: Well Developed, well nourished, and in no acute distress.  Neuro: Alert and oriented x3, extra-ocular muscles intact, sensation grossly intact. Cranial nerves II through XII are intact, motor, sensory, and coordinative functions are all intact. HEENT: Normocephalic, atraumatic, pupils equal round reactive to light, neck supple, no masses, no lymphadenopathy, thyroid nonpalpable. Oropharynx, nasopharynx, external ear canals are unremarkable. Skin: Warm and  dry, no rashes noted.  Folliculitis on the left deltoid Cardiac: Regular rate and rhythm, no murmurs rubs or gallops.  Respiratory: Clear to auscultation bilaterally. Not using accessory muscles, speaking in full sentences.  Abdominal: Soft, nontender, nondistended, positive bowel sounds, no masses, no organomegaly.  Musculoskeletal: Shoulder, elbow, wrist, hip, knee, ankle stable, and with full range of motion.  Impression and Recommendations:    The patient was counselled, risk factors were discussed, anticipatory guidance given.  Annual  physical exam Annual physical as above. Pneumonia 23 vaccine.  Paroxysmal atrial fibrillation With diabetes his chads 2 score is 1, adding aspirin.  Hyperlipidemia Elevated triglycerides but we are going to recheck in a few months before treating.  Hypogonadism male Unable to get testosterone levels up with topical supplementation. Switching to injectable testosterone, first injection today, prescription written, he will return in a nurse visit to learn how to do testosterone shots in 2 weeks, and at the 3-week point from now we will recheck his levels.  I like to see him at that time though. We would need to check CBC, PSA, testosterone.  Left foot pain Has been about 3 weeks total in the boot, improving considerably, I am going to add another 3 weeks. My suspicion is fourth metatarsal stress injury, this can take 1 to 3 months to heal.  Plantar wart of right foot Patient will return in 3 weeks for repeat cryotherapy We did #6 plantar warts on the right foot on 22 May.  Folliculitis Left deltoid, doxycycline  Controlled type 2 diabetes mellitus without complication, without long-term current use of insulin (HCC) Doing well with XigDuo, prescription discount coupon given today. Good weight loss. We can recheck his hemoglobin A1c in 2-1/2 months. He is going to make an appointment with an eye doctor and at the 3-week follow-up visit we can get a urine microalbumin.  ___________________________________________ William Ortega, M.D., ABFM., CAQSM. Primary Care and Sports Medicine [facility]  Adjunct Instructor of Family Medicine  [employer]

## 2017-11-10 NOTE — Assessment & Plan Note (Signed)
Left deltoid, doxycycline

## 2017-11-10 NOTE — Assessment & Plan Note (Signed)
Patient will return in 3 weeks for repeat cryotherapy We did #6 plantar warts on the right foot on 22 May.

## 2017-11-10 NOTE — Addendum Note (Signed)
Addended by: Baird KayUGLAS, Kameron Blethen M on: 11/10/2017 11:24 AM   Modules accepted: Orders

## 2017-11-12 ENCOUNTER — Ambulatory Visit: Payer: BLUE CROSS/BLUE SHIELD | Admitting: Sports Medicine

## 2017-11-26 ENCOUNTER — Ambulatory Visit (INDEPENDENT_AMBULATORY_CARE_PROVIDER_SITE_OTHER): Payer: BLUE CROSS/BLUE SHIELD | Admitting: Sports Medicine

## 2017-11-26 VITALS — BP 129/79 | HR 64 | Wt 224.0 lb

## 2017-11-26 DIAGNOSIS — E291 Testicular hypofunction: Secondary | ICD-10-CM

## 2017-11-26 NOTE — Progress Notes (Signed)
   Subjective:    Patient ID: William Ortega, male    DOB: 07/07/1960, 57 y.o.   MRN: [id number]  HPI Patient here for teaching of how to give self injection of Testosterone. Instructed patient on proper technique of drawing up medication and changing needle before injecting into skin. Patient inject medication into left outer quad without any complications. Patient complains of no SOB, mood changes or reactions to medications or palpitations. KG LPN  Patient own testosterone 200mg /ml L# [id number]  Exp. 08/2019  NDC# [id number]. KG LPN Review of Systems     Objective:   Physical Exam        Assessment & Plan:  Instructed patient to return in 2 weeks to see MD and receive testosterone injection. KG LPN

## 2017-11-28 ENCOUNTER — Other Ambulatory Visit: Payer: Self-pay | Admitting: Sports Medicine

## 2017-11-30 DIAGNOSIS — E113292 Type 2 diabetes mellitus with mild nonproliferative diabetic retinopathy without macular edema, left eye: Secondary | ICD-10-CM | POA: Diagnosis not present

## 2017-11-30 LAB — HM DIABETES EYE EXAM

## 2017-12-04 ENCOUNTER — Ambulatory Visit: Payer: BLUE CROSS/BLUE SHIELD | Admitting: Sports Medicine

## 2017-12-04 DIAGNOSIS — M79672 Pain in left foot: Secondary | ICD-10-CM | POA: Diagnosis not present

## 2017-12-04 DIAGNOSIS — B070 Plantar wart: Secondary | ICD-10-CM | POA: Diagnosis not present

## 2017-12-04 DIAGNOSIS — E119 Type 2 diabetes mellitus without complications: Secondary | ICD-10-CM | POA: Diagnosis not present

## 2017-12-04 DIAGNOSIS — E291 Testicular hypofunction: Secondary | ICD-10-CM | POA: Diagnosis not present

## 2017-12-04 NOTE — Assessment & Plan Note (Signed)
Last testosterone shot was 8 days ago. Rechecking testosterone, CBC, PSA.

## 2017-12-04 NOTE — Progress Notes (Signed)
Subjective:    CC: Recheck stress fracture  HPI: Fourth metatarsal stress fracture: For the most part has no tenderness over the fracture, only intermittent discomfort.  Plantar warts: Cryotherapy x1 insufficient, here for cryotherapy #2.  Right foot.  Male hypogonadism: Due for testosterone recheck.  I reviewed the past medical history, family history, social history, surgical history, and allergies today and no changes were needed.  Please see the problem list section below in epic for further details.  Past Medical History: Past Medical History:  Diagnosis Date  . Allergy   . Atrial fibrillation (HCC)   . Bulging lumbar disc   . OSA (obstructive sleep apnea)    Past Surgical History: Past Surgical History:  Procedure Laterality Date  . KNEE ARTHROPLASTY    . NASAL SEPTUM SURGERY    . VASECTOMY     Social History: Social History   Socioeconomic History  . Marital status: Married    Spouse name: Not on file  . Number of children: Not on file  . Years of education: Not on file  . Highest education level: Not on file  Occupational History  . Not on file  Social Needs  . Financial resource strain: Not on file  . Food insecurity:    Worry: Not on file    Inability: Not on file  . Transportation needs:    Medical: Not on file    Non-medical: Not on file  Tobacco Use  . Smoking status: Never Smoker  . Smokeless tobacco: Never Used  Substance and Sexual Activity  . Alcohol use: No  . Drug use: No  . Sexual activity: Yes  Lifestyle  . Physical activity:    Days per week: Not on file    Minutes per session: Not on file  . Stress: Not on file  Relationships  . Social connections:    Talks on phone: Not on file    Gets together: Not on file    Attends religious service: Not on file    Active member of club or organization: Not on file    Attends meetings of clubs or organizations: Not on file    Relationship status: Not on file  Other Topics Concern  . Not  on file  Social History Narrative  . Not on file   Family History: Family History  Problem Relation Age of Onset  . Heart disease Mother   . Stroke Paternal Grandmother    Allergies: Allergies  Allergen Reactions  . Penicillins    Medications: See med rec.  Review of Systems: No fevers, chills, night sweats, weight loss, chest pain, or shortness of breath.   Objective:    General: Well Developed, well nourished, and in no acute distress.  Neuro: Alert and oriented x3, extra-ocular muscles intact, sensation grossly intact.  HEENT: Normocephalic, atraumatic, pupils equal round reactive to light, neck supple, no masses, no lymphadenopathy, thyroid nonpalpable.  Skin: Warm and dry, no rashes. Cardiac: Regular rate and rhythm, no murmurs rubs or gallops, no lower extremity edema.  Respiratory: Clear to auscultation bilaterally. Not using accessory muscles, speaking in full sentences. Left foot: No visible erythema or swelling. Range of motion is full in all directions. Strength is 5/5 in all directions. No hallux valgus. No pes cavus or pes planus. No abnormal callus noted. No pain over the navicular prominence, or base of fifth metatarsal. No tenderness to palpation of the calcaneal insertion of plantar fascia. No pain at the Achilles insertion. No pain over the calcaneal  bursa. No pain of the retrocalcaneal bursa. No tenderness over the fourth metatarsal shaft No hallux rigidus or limitus. No tenderness palpation over interphalangeal joints. No pain with compression of the metatarsal heads. Neurovascularly intact distally.  Procedure:  Cryodestruction of #4 plantar warts on the right foot Consent obtained and verified. Time-out conducted. Noted no overlying erythema, induration, or other signs of local infection. Completed without difficulty using Cryo-Gun. Advised to call if fevers/chills, erythema, induration, drainage, or persistent bleeding.  Impression and  Recommendations:    Plantar wart of right foot Cryotherapy of #4 plantar warts, previous cryotherapy was in May. If persistence of the large plantar wart on his right foot we will do a surgical excision at the follow-up visit with primary closure.  Hypogonadism male Last testosterone shot was 8 days ago. Rechecking testosterone, CBC, PSA.  Left foot pain Left fourth metatarsal stress injury. Good improvement after 6 weeks of boot immobilization, we did discuss that this could take 1 to 3 months to fully heal. Continue boot for at least another 2 weeks.  Controlled type 2 diabetes mellitus without complication, without long-term current use of insulin (HCC) Continues to do well with XigDuo. We can recheck his hemoglobin A1c in early September.   ___________________________________________ Ihor Austin. Benjamin Stain, M.D., ABFM., CAQSM. Primary Care and Sports Medicine [facility]  Adjunct Instructor of Family Medicine  [employer]

## 2017-12-04 NOTE — Assessment & Plan Note (Signed)
Cryotherapy of #4 plantar warts, previous cryotherapy was in May. If persistence of the large plantar wart on his right foot we will do a surgical excision at the follow-up visit with primary closure.

## 2017-12-04 NOTE — Assessment & Plan Note (Signed)
Left fourth metatarsal stress injury. Good improvement after 6 weeks of boot immobilization, we did discuss that this could take 1 to 3 months to fully heal. Continue boot for at least another 2 weeks.

## 2017-12-04 NOTE — Assessment & Plan Note (Signed)
Continues to do well with XigDuo. We can recheck his hemoglobin A1c in early September.

## 2017-12-07 ENCOUNTER — Ambulatory Visit: Payer: BLUE CROSS/BLUE SHIELD | Admitting: Sports Medicine

## 2017-12-08 LAB — TESTOSTERONE, FREE & TOTAL
Free Testosterone: 175.2 pg/mL — ABNORMAL HIGH (ref 35.0–155.0)
Testosterone, Total, LC-MS-MS: 619 ng/dL (ref 250–1100)

## 2017-12-08 LAB — CBC
HCT: 45 % (ref 38.5–50.0)
Hemoglobin: 15.8 g/dL (ref 13.2–17.1)
MCH: 31.9 pg (ref 27.0–33.0)
MCHC: 35.1 g/dL (ref 32.0–36.0)
MCV: 90.9 fL (ref 80.0–100.0)
MPV: 10.3 fL (ref 7.5–12.5)
Platelets: 175 Thousand/uL (ref 140–400)
RBC: 4.95 Million/uL (ref 4.20–5.80)
RDW: 12.9 % (ref 11.0–15.0)
WBC: 6.7 10*3/uL (ref 3.8–10.8)

## 2017-12-08 LAB — PSA, TOTAL AND FREE
PSA, % Free: 20 % (calc) — ABNORMAL LOW (ref >25)
PSA, Free: 0.2 ng/mL
PSA, Total: 1 ng/mL (ref <=4.0)

## 2017-12-08 LAB — MICROALBUMIN / CREATININE URINE RATIO
Creatinine, Urine: 87 mg/dL (ref 20–320)
Microalb Creat Ratio: 5 ug/mg{creat} (ref <30)
Microalb, Ur: 0.4 mg/dL

## 2017-12-31 ENCOUNTER — Encounter: Payer: Self-pay | Admitting: Sports Medicine

## 2017-12-31 ENCOUNTER — Ambulatory Visit: Payer: BLUE CROSS/BLUE SHIELD | Admitting: Sports Medicine

## 2017-12-31 ENCOUNTER — Telehealth: Payer: Self-pay | Admitting: Sports Medicine

## 2017-12-31 DIAGNOSIS — L600 Ingrowing nail: Secondary | ICD-10-CM | POA: Diagnosis not present

## 2017-12-31 DIAGNOSIS — M79672 Pain in left foot: Secondary | ICD-10-CM

## 2017-12-31 DIAGNOSIS — B070 Plantar wart: Secondary | ICD-10-CM

## 2017-12-31 NOTE — Assessment & Plan Note (Addendum)
They look much better, we did cryotherapy of #4 plantar warts at the last visit. DermaBlade shave we were out of liquid nitrogen, return in a couple of weeks.

## 2017-12-31 NOTE — Progress Notes (Signed)
Subjective:    CC: Follow-up several issues  HPI: Left foot pain: Suspected fourth metatarsal stress fracture, had 8 weeks of boot immobilization and is for the most part pain-free now.  May return to work.  Plantar warts: Right foot, we have done 2 sessions of cryotherapy, they are for the most part gone.  Ingrown toenail: Right great toe, desires medial and lateral nail plate removal.  I reviewed the past medical history, family history, social history, surgical history, and allergies today and no changes were needed.  Please see the problem list section below in epic for further details.  Past Medical History: Past Medical History:  Diagnosis Date  . Allergy   . Atrial fibrillation (HCC)   . Bulging lumbar disc   . OSA (obstructive sleep apnea)    Past Surgical History: Past Surgical History:  Procedure Laterality Date  . KNEE ARTHROPLASTY    . NASAL SEPTUM SURGERY    . VASECTOMY     Social History: Social History   Socioeconomic History  . Marital status: Married    Spouse name: Not on file  . Number of children: Not on file  . Years of education: Not on file  . Highest education level: Not on file  Occupational History  . Not on file  Social Needs  . Financial resource strain: Not on file  . Food insecurity:    Worry: Not on file    Inability: Not on file  . Transportation needs:    Medical: Not on file    Non-medical: Not on file  Tobacco Use  . Smoking status: Never Smoker  . Smokeless tobacco: Never Used  Substance and Sexual Activity  . Alcohol use: No  . Drug use: No  . Sexual activity: Yes  Lifestyle  . Physical activity:    Days per week: Not on file    Minutes per session: Not on file  . Stress: Not on file  Relationships  . Social connections:    Talks on phone: Not on file    Gets together: Not on file    Attends religious service: Not on file    Active member of club or organization: Not on file    Attends meetings of clubs or  organizations: Not on file    Relationship status: Not on file  Other Topics Concern  . Not on file  Social History Narrative  . Not on file   Family History: Family History  Problem Relation Age of Onset  . Heart disease Mother   . Stroke Paternal Grandmother    Allergies: Allergies  Allergen Reactions  . Penicillins    Medications: See med rec.  Review of Systems: No fevers, chills, night sweats, weight loss, chest pain, or shortness of breath.   Objective:    General: Well Developed, well nourished, and in no acute distress.  Neuro: Alert and oriented x3, extra-ocular muscles intact, sensation grossly intact.  HEENT: Normocephalic, atraumatic, pupils equal round reactive to light, neck supple, no masses, no lymphadenopathy, thyroid nonpalpable.  Skin: Warm and dry, no rashes. Cardiac: Regular rate and rhythm, no murmurs rubs or gallops, no lower extremity edema.  Respiratory: Clear to auscultation bilaterally. Not using accessory muscles, speaking in full sentences.  Procedure: DermaBlade shave of #3 plantar warts on the right foot Risks, benefits, and alternatives explained and consent obtained. Time out conducted. Surface prepped with alcohol. Adequate anesthesia ensured. Area prepped and draped in a sterile fashion. Excision performed with: Using a DermaBlade I  shaved the plantar warts down to bleeding epidermis, these were then treated with silver nitrate for hemostasis. Pt stable.  Impression and Recommendations:    Plantar wart of right foot They look much better, we did cryotherapy of #4 plantar warts at the last visit. DermaBlade shave we were out of liquid nitrogen, return in a couple of weeks.  Left foot pain Left fourth metatarsal stress injury has for the most part resolved after 8 weeks of boot immobilization, may return to work and wear regular shoes now.  Ingrown toenail of right foot Return for medial and lateral nail plate removal with phenol  matricectomy of the right great toenail. ___________________________________________ Ihor Austin. Benjamin Stain, M.D., ABFM., CAQSM. Primary Care and Sports Medicine [facility]  Adjunct Instructor of Family Medicine  [employer]

## 2017-12-31 NOTE — Assessment & Plan Note (Signed)
Left fourth metatarsal stress injury has for the most part resolved after 8 weeks of boot immobilization, may return to work and wear regular shoes now.

## 2017-12-31 NOTE — Assessment & Plan Note (Signed)
Return for medial and lateral nail plate removal with phenol matricectomy of the right great toenail.

## 2017-12-31 NOTE — Telephone Encounter (Signed)
Pt was here today and you gave him a work note but he needs Dr.T to add a return date to work for 01/04/18 and Fax to [phone] ATT: Sammuel CooperLaura Deanton Please Fax TODAY and also leave him a printed copy at the front desk for him to pick up for his records as well.

## 2018-01-06 DIAGNOSIS — I951 Orthostatic hypotension: Secondary | ICD-10-CM | POA: Diagnosis not present

## 2018-01-06 DIAGNOSIS — G4733 Obstructive sleep apnea (adult) (pediatric): Secondary | ICD-10-CM | POA: Diagnosis not present

## 2018-01-10 ENCOUNTER — Other Ambulatory Visit: Payer: Self-pay | Admitting: Sports Medicine

## 2018-01-10 DIAGNOSIS — L308 Other specified dermatitis: Secondary | ICD-10-CM

## 2018-01-13 DIAGNOSIS — I480 Paroxysmal atrial fibrillation: Secondary | ICD-10-CM | POA: Diagnosis not present

## 2018-01-13 DIAGNOSIS — Z9989 Dependence on other enabling machines and devices: Secondary | ICD-10-CM | POA: Diagnosis not present

## 2018-01-13 DIAGNOSIS — G4733 Obstructive sleep apnea (adult) (pediatric): Secondary | ICD-10-CM | POA: Diagnosis not present

## 2018-01-18 ENCOUNTER — Encounter: Payer: Self-pay | Admitting: Sports Medicine

## 2018-01-18 ENCOUNTER — Ambulatory Visit: Payer: BLUE CROSS/BLUE SHIELD | Admitting: Sports Medicine

## 2018-01-18 DIAGNOSIS — L600 Ingrowing nail: Secondary | ICD-10-CM | POA: Diagnosis not present

## 2018-01-18 MED ORDER — HYDROCODONE-ACETAMINOPHEN 5-325 MG PO TABS: 1.0000 | ORAL_TABLET | Freq: Three times a day (TID) | ORAL | 0 refills | Status: DC | PRN

## 2018-01-18 NOTE — Progress Notes (Signed)
    Procedure:  Removal of right medial and lateral nail plate with phenol matricectomy Risks, benefits, alternatives explained to patient. Consent obtained. Time out conducted. Noted no overlying induration or erythema at site of injection. Toe cleaned with alcohol, then a total of 10cc lidocaine 2% infiltrated at adjacent webspaces at the location of the bifurcation of the common digital nerve to proper digital nerves.  Some lidocaine also infiltrated at hyponychium and under nail bed.  Adequate anesthesia ensured. Toe prepped and draped in a sterile fashion. Nail elevator used to separate nail plate from nail bed. Clippers used to cut toenail in a longitudinal fashion to proximal nail fold and matrix. Hemostat then used to separate nail fragment from surrounding structures. Nail bed and matrix treated. Minor bleeding controlled with pressure and phenol. Antibiotic ointment applied. Toe dressed. Advised to return if increased redness, swelling, drainage, fevers, or chills.

## 2018-01-18 NOTE — Assessment & Plan Note (Signed)
Medial and lateral nail plate removal with phenol matricectomy on the right great toenail. Return to see me in 2 weeks for a wound check. Hydrocodone for postprocedural pain.

## 2018-01-20 ENCOUNTER — Other Ambulatory Visit: Payer: Self-pay | Admitting: Sports Medicine

## 2018-02-01 ENCOUNTER — Ambulatory Visit: Payer: BLUE CROSS/BLUE SHIELD | Admitting: Sports Medicine

## 2018-02-01 ENCOUNTER — Encounter: Payer: Self-pay | Admitting: Sports Medicine

## 2018-02-01 DIAGNOSIS — B070 Plantar wart: Secondary | ICD-10-CM | POA: Diagnosis not present

## 2018-02-01 DIAGNOSIS — L600 Ingrowing nail: Secondary | ICD-10-CM | POA: Diagnosis not present

## 2018-02-01 NOTE — Progress Notes (Signed)
Subjective:    CC: Follow-up  HPI: Plantar warts: Continues to improve, still has a bit of residual wart in 2 locations on the bottom of his foot.  Ingrown toenail: I removed the medial and lateral nail plate of the right great toenail and treated with a phenol matricectomy, doing extremely well.  I reviewed the past medical history, family history, social history, surgical history, and allergies today and no changes were needed.  Please see the problem list section below in epic for further details.  Past Medical History: Past Medical History:  Diagnosis Date  . Allergy   . Atrial fibrillation (HCC)   . Bulging lumbar disc   . OSA (obstructive sleep apnea)    Past Surgical History: Past Surgical History:  Procedure Laterality Date  . KNEE ARTHROPLASTY    . NASAL SEPTUM SURGERY    . VASECTOMY     Social History: Social History   Socioeconomic History  . Marital status: Married    Spouse name: Not on file  . Number of children: Not on file  . Years of education: Not on file  . Highest education level: Not on file  Occupational History  . Not on file  Social Needs  . Financial resource strain: Not on file  . Food insecurity:    Worry: Not on file    Inability: Not on file  . Transportation needs:    Medical: Not on file    Non-medical: Not on file  Tobacco Use  . Smoking status: Never Smoker  . Smokeless tobacco: Never Used  Substance and Sexual Activity  . Alcohol use: No  . Drug use: No  . Sexual activity: Yes  Lifestyle  . Physical activity:    Days per week: Not on file    Minutes per session: Not on file  . Stress: Not on file  Relationships  . Social connections:    Talks on phone: Not on file    Gets together: Not on file    Attends religious service: Not on file    Active member of club or organization: Not on file    Attends meetings of clubs or organizations: Not on file    Relationship status: Not on file  Other Topics Concern  . Not on  file  Social History Narrative  . Not on file   Family History: Family History  Problem Relation Age of Onset  . Heart disease Mother   . Stroke Paternal Grandmother    Allergies: Allergies  Allergen Reactions  . Penicillins    Medications: See med rec.  Review of Systems: No fevers, chills, night sweats, weight loss, chest pain, or shortness of breath.   Objective:    General: Well Developed, well nourished, and in no acute distress.  Neuro: Alert and oriented x3, extra-ocular muscles intact, sensation grossly intact.  HEENT: Normocephalic, atraumatic, pupils equal round reactive to light, neck supple, no masses, no lymphadenopathy, thyroid nonpalpable.  Skin: Warm and dry, no rashes. Cardiac: Regular rate and rhythm, no murmurs rubs or gallops, no lower extremity edema.  Respiratory: Clear to auscultation bilaterally. Not using accessory muscles, speaking in full sentences. Right great toenail: Healing well.  Procedure:  Cryodestruction of #2 plantar warts Consent obtained and verified. Time-out conducted. Noted no overlying erythema, induration, or other signs of local infection. Used a DermaBlade to shave the wart to a fresh bleeding surface, then completed the rest without difficulty using Cryo-Gun. Advised to call if fevers/chills, erythema, induration, drainage, or persistent  bleeding.  Impression and Recommendations:    Ingrown toenail of right foot Doing well after medial and lateral nail plate removal of the right great toenail with phenol matricectomy. Return in 1 month, overall healing well.  Plantar wart of right foot Doing much better after cryotherapy of #4 plantar warts 2 months ago. DermaBlade used and additional liquid nitrogen used to shave remaining wart down to fresh bleeding surface. Return in 1 month to recheck. ___________________________________________ Ihor Austin. Benjamin Stain, M.D., ABFM., CAQSM. Primary Care and Sports Medicine [place]  [facility]  Adjunct Instructor of Family Medicine  [employer]

## 2018-02-01 NOTE — Assessment & Plan Note (Signed)
Doing much better after cryotherapy of #4 plantar warts 2 months ago. DermaBlade used and additional liquid nitrogen used to shave remaining wart down to fresh bleeding surface. Return in 1 month to recheck.

## 2018-02-01 NOTE — Assessment & Plan Note (Signed)
Doing well after medial and lateral nail plate removal of the right great toenail with phenol matricectomy. Return in 1 month, overall healing well.

## 2018-02-13 ENCOUNTER — Other Ambulatory Visit: Payer: Self-pay | Admitting: Physician Assistant

## 2018-02-13 DIAGNOSIS — G6289 Other specified polyneuropathies: Secondary | ICD-10-CM

## 2018-02-19 ENCOUNTER — Other Ambulatory Visit: Payer: Self-pay | Admitting: Sports Medicine

## 2018-03-05 ENCOUNTER — Ambulatory Visit: Payer: BLUE CROSS/BLUE SHIELD | Admitting: Sports Medicine

## 2018-03-05 ENCOUNTER — Encounter: Payer: Self-pay | Admitting: Sports Medicine

## 2018-03-05 DIAGNOSIS — B070 Plantar wart: Secondary | ICD-10-CM

## 2018-03-05 NOTE — Assessment & Plan Note (Signed)
Much better, repeat DermaBlade shave of the warts and aggressive cryotherapy. Return in 1 month.

## 2018-03-05 NOTE — Progress Notes (Signed)
Subjective:    CC: Follow-up  HPI: Plantar warts: Fantastic improvements with previous cryotherapy and shaving, needs a mild repeat today.  I reviewed the past medical history, family history, social history, surgical history, and allergies today and no changes were needed.  Please see the problem list section below in epic for further details.  Past Medical History: Past Medical History:  Diagnosis Date  . Allergy   . Atrial fibrillation (HCC)   . Bulging lumbar disc   . OSA (obstructive sleep apnea)    Past Surgical History: Past Surgical History:  Procedure Laterality Date  . KNEE ARTHROPLASTY    . NASAL SEPTUM SURGERY    . VASECTOMY     Social History: Social History   Socioeconomic History  . Marital status: Married    Spouse name: Not on file  . Number of children: Not on file  . Years of education: Not on file  . Highest education level: Not on file  Occupational History  . Not on file  Social Needs  . Financial resource strain: Not on file  . Food insecurity:    Worry: Not on file    Inability: Not on file  . Transportation needs:    Medical: Not on file    Non-medical: Not on file  Tobacco Use  . Smoking status: Never Smoker  . Smokeless tobacco: Never Used  Substance and Sexual Activity  . Alcohol use: No  . Drug use: No  . Sexual activity: Yes  Lifestyle  . Physical activity:    Days per week: Not on file    Minutes per session: Not on file  . Stress: Not on file  Relationships  . Social connections:    Talks on phone: Not on file    Gets together: Not on file    Attends religious service: Not on file    Active member of club or organization: Not on file    Attends meetings of clubs or organizations: Not on file    Relationship status: Not on file  Other Topics Concern  . Not on file  Social History Narrative  . Not on file   Family History: Family History  Problem Relation Age of Onset  . Heart disease Mother   . Stroke Paternal  Grandmother    Allergies: Allergies  Allergen Reactions  . Penicillins    Medications: See med rec.  Review of Systems: No fevers, chills, night sweats, weight loss, chest pain, or shortness of breath.   Objective:    General: Well Developed, well nourished, and in no acute distress.  Neuro: Alert and oriented x3, extra-ocular muscles intact, sensation grossly intact.  HEENT: Normocephalic, atraumatic, pupils equal round reactive to light, neck supple, no masses, no lymphadenopathy, thyroid nonpalpable.  Skin: Warm and dry, no rashes. Cardiac: Regular rate and rhythm, no murmurs rubs or gallops, no lower extremity edema.  Respiratory: Clear to auscultation bilaterally. Not using accessory muscles, speaking in full sentences.  Procedure:  Cryodestruction of #2 plantar warts Consent obtained and verified. Time-out conducted. Noted no overlying erythema, induration, or other signs of local infection. Used a DermaBlade to shave the wart until thrombosed capillary edges were no longer visible, then completed the rest without difficulty using Cryo-Gun. Advised to call if fevers/chills, erythema, induration, drainage, or persistent bleeding.  Impression and Recommendations:    Plantar wart of right foot Much better, repeat DermaBlade shave of the warts and aggressive cryotherapy. Return in 1 month.  ___________________________________________ Ihor Austin. Benjamin Stain, M.D.,  ABFM., CAQSM. Primary Care and [place] Instructor of [place] [employer] [facility] of Medicine

## 2018-03-13 ENCOUNTER — Other Ambulatory Visit: Payer: Self-pay | Admitting: Physician Assistant

## 2018-03-13 DIAGNOSIS — G6289 Other specified polyneuropathies: Secondary | ICD-10-CM

## 2018-04-02 ENCOUNTER — Ambulatory Visit: Payer: BLUE CROSS/BLUE SHIELD | Admitting: Sports Medicine

## 2018-04-09 ENCOUNTER — Other Ambulatory Visit: Payer: Self-pay | Admitting: Sports Medicine

## 2018-04-09 DIAGNOSIS — G4733 Obstructive sleep apnea (adult) (pediatric): Secondary | ICD-10-CM | POA: Diagnosis not present

## 2018-04-09 DIAGNOSIS — I951 Orthostatic hypotension: Secondary | ICD-10-CM | POA: Diagnosis not present

## 2018-04-09 DIAGNOSIS — G6289 Other specified polyneuropathies: Secondary | ICD-10-CM

## 2018-04-12 ENCOUNTER — Other Ambulatory Visit: Payer: Self-pay | Admitting: Sports Medicine

## 2018-04-12 DIAGNOSIS — E291 Testicular hypofunction: Secondary | ICD-10-CM

## 2018-05-11 ENCOUNTER — Ambulatory Visit: Payer: BLUE CROSS/BLUE SHIELD | Admitting: Sports Medicine

## 2018-05-11 ENCOUNTER — Encounter: Payer: Self-pay | Admitting: Sports Medicine

## 2018-05-11 VITALS — BP 122/77 | HR 75 | Ht 70.0 in | Wt 217.0 lb

## 2018-05-11 DIAGNOSIS — M19072 Primary osteoarthritis, left ankle and foot: Secondary | ICD-10-CM | POA: Diagnosis not present

## 2018-05-11 DIAGNOSIS — B070 Plantar wart: Secondary | ICD-10-CM | POA: Diagnosis not present

## 2018-05-11 DIAGNOSIS — M19071 Primary osteoarthritis, right ankle and foot: Secondary | ICD-10-CM | POA: Diagnosis not present

## 2018-05-11 DIAGNOSIS — E119 Type 2 diabetes mellitus without complications: Secondary | ICD-10-CM | POA: Diagnosis not present

## 2018-05-11 LAB — POCT GLYCOSYLATED HEMOGLOBIN (HGB A1C): Hemoglobin A1C: 5.3 % (ref 4.0–5.6)

## 2018-05-11 NOTE — Assessment & Plan Note (Signed)
Did well with fourth metatarsal/cuboid joint injections 2 years ago. He will wear some cushioned inserts when walking on concrete, the custom molded orthotics seem to create a bit more pain. Return as needed for this.

## 2018-05-11 NOTE — Assessment & Plan Note (Signed)
Hemoglobin A1c down to 5.3% on XigDuo, no changes.

## 2018-05-11 NOTE — Progress Notes (Signed)
Subjective:    CC: Follow-up  HPI: This is a pleasant 57 year old male, we have been treating him for his diabetes, hemoglobin A1c is down to 5.3% on XigDuo.  Plantar warts on the right foot: Completely resolved after several sessions of DermaBlade shaving and aggressive cryotherapy.  Bilateral midfoot osteoarthritis: Did have some increasing discomfort with custom orthotics however the custom orthotics did resolve his plantar fasciitis.  He has discontinued them.  Pain is localized over the midfoot, bilateral, moderate, persistent without radiation past the ankle, we did injections about 2 years ago that had good response.  I reviewed the past medical history, family history, social history, surgical history, and allergies today and no changes were needed.  Please see the problem list section below in epic for further details.  Past Medical History: Past Medical History:  Diagnosis Date  . Allergy   . Atrial fibrillation (HCC)   . Bulging lumbar disc   . OSA (obstructive sleep apnea)    Past Surgical History: Past Surgical History:  Procedure Laterality Date  . KNEE ARTHROPLASTY    . NASAL SEPTUM SURGERY    . VASECTOMY     Social History: Social History   Socioeconomic History  . Marital status: Married    Spouse name: Not on file  . Number of children: Not on file  . Years of education: Not on file  . Highest education level: Not on file  Occupational History  . Not on file  Social Needs  . Financial resource strain: Not on file  . Food insecurity:    Worry: Not on file    Inability: Not on file  . Transportation needs:    Medical: Not on file    Non-medical: Not on file  Tobacco Use  . Smoking status: Never Smoker  . Smokeless tobacco: Never Used  Substance and Sexual Activity  . Alcohol use: No  . Drug use: No  . Sexual activity: Yes  Lifestyle  . Physical activity:    Days per week: Not on file    Minutes per session: Not on file  . Stress: Not on file   Relationships  . Social connections:    Talks on phone: Not on file    Gets together: Not on file    Attends religious service: Not on file    Active member of club or organization: Not on file    Attends meetings of clubs or organizations: Not on file    Relationship status: Not on file  Other Topics Concern  . Not on file  Social History Narrative  . Not on file   Family History: Family History  Problem Relation Age of Onset  . Heart disease Mother   . Stroke Paternal Grandmother    Allergies: Allergies  Allergen Reactions  . Penicillins    Medications: See med rec.  Review of Systems: No fevers, chills, night sweats, weight loss, chest pain, or shortness of breath.   Objective:    General: Well Developed, well nourished, and in no acute distress.  Neuro: Alert and oriented x3, extra-ocular muscles intact, sensation grossly intact.  HEENT: Normocephalic, atraumatic, pupils equal round reactive to light, neck supple, no masses, no lymphadenopathy, thyroid nonpalpable.  Skin: Warm and dry, no rashes. Cardiac: Regular rate and rhythm, no murmurs rubs or gallops, no lower extremity edema.  Respiratory: Clear to auscultation bilaterally. Not using accessory muscles, speaking in full sentences.  Impression and Recommendations:    Controlled type 2 diabetes mellitus without complication,  without long-term current use of insulin (HCC) Hemoglobin A1c down to 5.3% on XigDuo, no changes.  Plantar wart of right foot Now completely resolved after aggressive cryotherapy and DermaBlade shaving of all of the warts.  Primary osteoarthritis of both feet Did well with fourth metatarsal/cuboid joint injections 2 years ago. He will wear some cushioned inserts when walking on concrete, the custom molded orthotics seem to create a bit more pain. Return as needed for this. ___________________________________________ Ihor Austin. Benjamin Stain, M.D., ABFM., CAQSM. Primary Care and Sports  Medicine [facility]  Adjunct Professor of Family Medicine  [employer]

## 2018-05-11 NOTE — Assessment & Plan Note (Signed)
Now completely resolved after aggressive cryotherapy and DermaBlade shaving of all of the warts.

## 2018-06-08 DIAGNOSIS — D3131 Benign neoplasm of right choroid: Secondary | ICD-10-CM | POA: Diagnosis not present

## 2018-06-08 LAB — HM DIABETES EYE EXAM

## 2018-06-18 ENCOUNTER — Other Ambulatory Visit: Payer: Self-pay | Admitting: Sports Medicine

## 2018-06-18 ENCOUNTER — Encounter: Payer: Self-pay | Admitting: Sports Medicine

## 2018-07-09 ENCOUNTER — Other Ambulatory Visit: Payer: Self-pay | Admitting: Sports Medicine

## 2018-07-22 DIAGNOSIS — G4733 Obstructive sleep apnea (adult) (pediatric): Secondary | ICD-10-CM | POA: Diagnosis not present

## 2018-07-22 DIAGNOSIS — I951 Orthostatic hypotension: Secondary | ICD-10-CM | POA: Diagnosis not present

## 2018-08-19 ENCOUNTER — Other Ambulatory Visit: Payer: Self-pay | Admitting: Sports Medicine

## 2018-08-19 MED ORDER — IPRATROPIUM BROMIDE 0.03 % NA SOLN: 1.0000 | Freq: Four times a day (QID) | NASAL | 0 refills | Status: DC

## 2018-08-30 ENCOUNTER — Other Ambulatory Visit: Payer: Self-pay | Admitting: Sports Medicine

## 2018-08-30 DIAGNOSIS — E291 Testicular hypofunction: Secondary | ICD-10-CM

## 2018-08-30 MED ORDER — TESTOSTERONE CYPIONATE 200 MG/ML IM SOLN: 200.0000 mg | INTRAMUSCULAR | 4 refills | Status: DC

## 2018-09-18 ENCOUNTER — Other Ambulatory Visit: Payer: Self-pay | Admitting: Sports Medicine

## 2018-09-30 ENCOUNTER — Telehealth: Payer: Self-pay | Admitting: Sports Medicine

## 2018-09-30 DIAGNOSIS — E119 Type 2 diabetes mellitus without complications: Secondary | ICD-10-CM

## 2018-09-30 MED ORDER — DAPAGLIFLOZIN PRO-METFORMIN ER 10-1000 MG PO TB24: 1.0000 | ORAL_TABLET | Freq: Every day | ORAL | 5 refills | Status: DC

## 2018-09-30 NOTE — Telephone Encounter (Signed)
Last OV was 05/2018. Pharmacy is requesting PA but it was supposed to be a refill. Does he need a follow and is it ok to send in [place]. Please advise.    William Ortega is covered by current benefit plan. No further PA activity needed.

## 2018-09-30 NOTE — Telephone Encounter (Signed)
Noted. Thanks.

## 2018-09-30 NOTE — Telephone Encounter (Signed)
No office visit needed, I am okay seeing people at least once every 6 months, his A1c was controlled, okay to just refill the medication.

## 2018-10-12 ENCOUNTER — Ambulatory Visit (INDEPENDENT_AMBULATORY_CARE_PROVIDER_SITE_OTHER): Payer: BLUE CROSS/BLUE SHIELD | Admitting: Sports Medicine

## 2018-10-12 ENCOUNTER — Other Ambulatory Visit: Payer: Self-pay | Admitting: Sports Medicine

## 2018-10-12 DIAGNOSIS — M19071 Primary osteoarthritis, right ankle and foot: Secondary | ICD-10-CM

## 2018-10-12 DIAGNOSIS — M5136 Other intervertebral disc degeneration, lumbar region: Secondary | ICD-10-CM | POA: Diagnosis not present

## 2018-10-12 DIAGNOSIS — M19072 Primary osteoarthritis, left ankle and foot: Secondary | ICD-10-CM | POA: Diagnosis not present

## 2018-10-12 DIAGNOSIS — M51369 Other intervertebral disc degeneration, lumbar region without mention of lumbar back pain or lower extremity pain: Secondary | ICD-10-CM

## 2018-10-12 MED ORDER — PREDNISONE 50 MG PO TABS: ORAL_TABLET | ORAL | 0 refills | Status: DC

## 2018-10-12 NOTE — Progress Notes (Signed)
Virtual Visit via Telephone   I connected with  William Ortega  on 10/12/18 by telephone/telehealth and verified that I am speaking with the correct person using two identifiers.   I discussed the limitations, risks, security and privacy concerns of performing an evaluation and management service by telephone, including the higher likelihood of inaccurate diagnosis and treatment, and the availability of in person appointments.  We also discussed the likely need of an additional face to face encounter for complete and high quality delivery of care.  I also discussed with the patient that there may be a patient responsible charge related to this service. The patient expressed understanding and wishes to proceed.  Provider location is either at home or medical facility. Patient location is at their home, different from provider location. People involved in care of the patient during this telehealth encounter were myself, my nurse/medical assistant, and my front office/scheduling team member.  Subjective:    CC: Foot pain, back pain  HPI: This is a pleasant 58 year old male with chronic left worse than right midfoot pain.  Initially we tried some custom molded orthotics without much improvement.  I did a metatarsal cuboid joint injection approximately 2.5 years ago and he did really well.  We tried some conservative measures with diclofenac, nothing helping, pain is severe, persistent, localized over the the dorsal midfoot without radiation.  Low back pain: Known lumbar DDD, we treated this previously with a burst of prednisone that seemed to work well, he is having an increasing amount of pain, moderate, persistent, no bowel or bladder dysfunction, saddle numbness, constitutional symptoms.  I reviewed the past medical history, family history, social history, surgical history, and allergies today and no changes were needed.  Please see the problem list section below in epic for further details.   Past Medical History: Past Medical History:  Diagnosis Date  . Allergy   . Atrial fibrillation (HCC)   . Bulging lumbar disc   . OSA (obstructive sleep apnea)    Past Surgical History: Past Surgical History:  Procedure Laterality Date  . KNEE ARTHROPLASTY    . NASAL SEPTUM SURGERY    . VASECTOMY     Social History: Social History   Socioeconomic History  . Marital status: Married    Spouse name: Not on file  . Number of children: Not on file  . Years of education: Not on file  . Highest education level: Not on file  Occupational History  . Not on file  Social Needs  . Financial resource strain: Not on file  . Food insecurity:    Worry: Not on file    Inability: Not on file  . Transportation needs:    Medical: Not on file    Non-medical: Not on file  Tobacco Use  . Smoking status: Never Smoker  . Smokeless tobacco: Never Used  Substance and Sexual Activity  . Alcohol use: No  . Drug use: No  . Sexual activity: Yes  Lifestyle  . Physical activity:    Days per week: Not on file    Minutes per session: Not on file  . Stress: Not on file  Relationships  . Social connections:    Talks on phone: Not on file    Gets together: Not on file    Attends religious service: Not on file    Active member of club or organization: Not on file    Attends meetings of clubs or organizations: Not on file    Relationship  status: Not on file  Other Topics Concern  . Not on file  Social History Narrative  . Not on file   Family History: Family History  Problem Relation Age of Onset  . Heart disease Mother   . Stroke Paternal Grandmother    Allergies: Allergies  Allergen Reactions  . Penicillins    Medications: See med rec.  Review of Systems: No fevers, chills, night sweats, weight loss, chest pain, or shortness of breath.   Objective:    General: Speaking full sentences, no audible heavy breathing.  Sounds alert and appropriately interactive.  No other physical  exam performed due to the non-face to face nature of this visit.  Impression and Recommendations:    Lumbar degenerative disc disease Adding a burst of prednisone, we can follow this up in a month if no better.  Primary osteoarthritis of both feet Fourth metatarsal/cuboid joint injection 2 years ago was effective. Cushioned inserts have not been helpful, custom molded orthotics are not helpful, oral Voltaren is not helpful. He does need this reevaluated. It is possible that we will do an injection tomorrow if warranted, I will need to perform evaluation and management tomorrow to determine this.   I discussed the above assessment and treatment plan with the patient. The patient was provided an opportunity to ask questions and all were answered. The patient agreed with the plan and demonstrated an understanding of the instructions.   The patient was advised to call back or seek an in-person evaluation if the symptoms worsen or if the condition fails to improve as anticipated.   I provided 25 minutes of non-face-to-face time during this encounter, 15 minutes of additional time was needed to gather information, review chart, records, communicate/coordinate with staff remotely, and complete documentation.   ___________________________________________ Ihor Austin. Benjamin Stain, M.D., ABFM., CAQSM. Primary Care and Sports Medicine [facility]  Adjunct Professor of Family Medicine  [employer]

## 2018-10-12 NOTE — Assessment & Plan Note (Signed)
Fourth metatarsal/cuboid joint injection 2 years ago was effective. Cushioned inserts have not been helpful, custom molded orthotics are not helpful, oral Voltaren is not helpful. He does need this reevaluated. It is possible that we will do an injection tomorrow if warranted, I will need to perform evaluation and management tomorrow to determine this.

## 2018-10-12 NOTE — Assessment & Plan Note (Signed)
Adding a burst of prednisone, we can follow this up in a month if no better.

## 2018-10-12 NOTE — Telephone Encounter (Signed)
Scheduled patient for this afternoon.

## 2018-10-13 ENCOUNTER — Ambulatory Visit (INDEPENDENT_AMBULATORY_CARE_PROVIDER_SITE_OTHER): Payer: BLUE CROSS/BLUE SHIELD | Admitting: Sports Medicine

## 2018-10-13 DIAGNOSIS — M79672 Pain in left foot: Secondary | ICD-10-CM

## 2018-10-13 MED ORDER — TRAMADOL HCL 50 MG PO TABS: 50.0000 mg | ORAL_TABLET | Freq: Three times a day (TID) | ORAL | 0 refills | Status: DC | PRN

## 2018-10-13 NOTE — Progress Notes (Signed)
Subjective:    CC: Left foot pain  HPI: This is a pleasant 58 year old male, he has persistent left foot pain, present for over a year now, we did get this to resolve with several months of boot immobilization.  Pain is localized over the dorsum of the fourth metatarsal shaft, moderate, persistent, localized without radiation.  I reviewed the past medical history, family history, social history, surgical history, and allergies today and no changes were needed.  Please see the problem list section below in epic for further details.  Past Medical History: Past Medical History:  Diagnosis Date  . Allergy   . Atrial fibrillation (HCC)   . Bulging lumbar disc   . OSA (obstructive sleep apnea)    Past Surgical History: Past Surgical History:  Procedure Laterality Date  . KNEE ARTHROPLASTY    . NASAL SEPTUM SURGERY    . VASECTOMY     Social History: Social History   Socioeconomic History  . Marital status: Married    Spouse name: Not on file  . Number of children: Not on file  . Years of education: Not on file  . Highest education level: Not on file  Occupational History  . Not on file  Social Needs  . Financial resource strain: Not on file  . Food insecurity:    Worry: Not on file    Inability: Not on file  . Transportation needs:    Medical: Not on file    Non-medical: Not on file  Tobacco Use  . Smoking status: Never Smoker  . Smokeless tobacco: Never Used  Substance and Sexual Activity  . Alcohol use: No  . Drug use: No  . Sexual activity: Yes  Lifestyle  . Physical activity:    Days per week: Not on file    Minutes per session: Not on file  . Stress: Not on file  Relationships  . Social connections:    Talks on phone: Not on file    Gets together: Not on file    Attends religious service: Not on file    Active member of club or organization: Not on file    Attends meetings of clubs or organizations: Not on file    Relationship status: Not on file  Other  Topics Concern  . Not on file  Social History Narrative  . Not on file   Family History: Family History  Problem Relation Age of Onset  . Heart disease Mother   . Stroke Paternal Grandmother    Allergies: Allergies  Allergen Reactions  . Penicillins    Medications: See med rec.  Review of Systems: No fevers, chills, night sweats, weight loss, chest pain, or shortness of breath.   Objective:    General: Well Developed, well nourished, and in no acute distress.  Neuro: Alert and oriented x3, extra-ocular muscles intact, sensation grossly intact.  HEENT: Normocephalic, atraumatic, pupils equal round reactive to light, neck supple, no masses, no lymphadenopathy, thyroid nonpalpable.  Skin: Warm and dry, no rashes. Cardiac: Regular rate and rhythm, no murmurs rubs or gallops, no lower extremity edema.  Respiratory: Clear to auscultation bilaterally. Not using accessory muscles, speaking in full sentences. Left foot: Minimal fullness with tenderness to palpation over the dorsal fourth metatarsal shaft. Range of motion is full in all directions. Strength is 5/5 in all directions. No hallux valgus. No pes cavus or pes planus. No abnormal callus noted. No pain over the navicular prominence, or base of fifth metatarsal. No tenderness to palpation of  the calcaneal insertion of plantar fascia. No pain at the Achilles insertion. No pain over the calcaneal bursa. No pain of the retrocalcaneal bursa. No hallux rigidus or limitus. No tenderness palpation over interphalangeal joints. No pain with compression of the metatarsal heads. Neurovascularly intact distally.  Impression and Recommendations:    Left foot pain We suspected a left fourth metatarsal stress injury about a year ago, it resolved after 8 weeks of boot immobilization. He never had an MRI. Persistent pain, localized over the fourth metatarsal shaft, we do need an MRI at this point. If he does have a stress injury we  will proceed with cast immobilization for at least 1 month. Tramadol for pain in the meantime.   ___________________________________________ Ihor Austin. Benjamin Stain, M.D., ABFM., CAQSM. Primary Care and Sports Medicine [facility]  Adjunct Professor of Family Medicine  [employer]

## 2018-10-13 NOTE — Assessment & Plan Note (Signed)
We suspected a left fourth metatarsal stress injury about a year ago, it resolved after 8 weeks of boot immobilization. He never had an MRI. Persistent pain, localized over the fourth metatarsal shaft, we do need an MRI at this point. If he does have a stress injury we will proceed with cast immobilization for at least 1 month. Tramadol for pain in the meantime.

## 2018-10-15 ENCOUNTER — Other Ambulatory Visit: Payer: Self-pay | Admitting: Sports Medicine

## 2018-10-17 ENCOUNTER — Other Ambulatory Visit: Payer: Self-pay

## 2018-10-17 ENCOUNTER — Ambulatory Visit (INDEPENDENT_AMBULATORY_CARE_PROVIDER_SITE_OTHER): Payer: BLUE CROSS/BLUE SHIELD

## 2018-10-17 DIAGNOSIS — M19072 Primary osteoarthritis, left ankle and foot: Secondary | ICD-10-CM | POA: Diagnosis not present

## 2018-10-17 DIAGNOSIS — M79672 Pain in left foot: Secondary | ICD-10-CM

## 2018-10-19 ENCOUNTER — Ambulatory Visit (INDEPENDENT_AMBULATORY_CARE_PROVIDER_SITE_OTHER): Payer: BLUE CROSS/BLUE SHIELD | Admitting: Sports Medicine

## 2018-10-19 ENCOUNTER — Encounter: Payer: Self-pay | Admitting: Sports Medicine

## 2018-10-19 DIAGNOSIS — M19071 Primary osteoarthritis, right ankle and foot: Secondary | ICD-10-CM | POA: Diagnosis not present

## 2018-10-19 DIAGNOSIS — M19072 Primary osteoarthritis, left ankle and foot: Secondary | ICD-10-CM

## 2018-10-19 NOTE — Progress Notes (Signed)
Subjective:    CC: Left foot pain  HPI: William Ortega is a pleasant 58 year old male with chronic left dorsal midfoot pain.  Initially we suspected stress injury, MRI did not show any evidence of a stress fracture but did show significant midfoot osteoarthritis.  Pain is moderate, persistent, localized over the dorsal midfoot without radiation, failed oral analgesics and other conservative measures.  We did make him orthotics in the distant past but he thinks he may need a new pair.  I reviewed the past medical history, family history, social history, surgical history, and allergies today and no changes were needed.  Please see the problem list section below in epic for further details.  Past Medical History: Past Medical History:  Diagnosis Date  . Allergy   . Atrial fibrillation (HCC)   . Bulging lumbar disc   . OSA (obstructive sleep apnea)    Past Surgical History: Past Surgical History:  Procedure Laterality Date  . KNEE ARTHROPLASTY    . NASAL SEPTUM SURGERY    . VASECTOMY     Social History: Social History   Socioeconomic History  . Marital status: Married    Spouse name: Not on file  . Number of children: Not on file  . Years of education: Not on file  . Highest education level: Not on file  Occupational History  . Not on file  Social Needs  . Financial resource strain: Not on file  . Food insecurity:    Worry: Not on file    Inability: Not on file  . Transportation needs:    Medical: Not on file    Non-medical: Not on file  Tobacco Use  . Smoking status: Never Smoker  . Smokeless tobacco: Never Used  Substance and Sexual Activity  . Alcohol use: No  . Drug use: No  . Sexual activity: Yes  Lifestyle  . Physical activity:    Days per week: Not on file    Minutes per session: Not on file  . Stress: Not on file  Relationships  . Social connections:    Talks on phone: Not on file    Gets together: Not on file    Attends religious service: Not on file   Active member of club or organization: Not on file    Attends meetings of clubs or organizations: Not on file    Relationship status: Not on file  Other Topics Concern  . Not on file  Social History Narrative  . Not on file   Family History: Family History  Problem Relation Age of Onset  . Heart disease Mother   . Stroke Paternal Grandmother    Allergies: Allergies  Allergen Reactions  . Penicillins    Medications: See med rec.  Review of Systems: No fevers, chills, night sweats, weight loss, chest pain, or shortness of breath.   Objective:    General: Well Developed, well nourished, and in no acute distress.  Neuro: Alert and oriented x3, extra-ocular muscles intact, sensation grossly intact.  HEENT: Normocephalic, atraumatic, pupils equal round reactive to light, neck supple, no masses, no lymphadenopathy, thyroid nonpalpable.  Skin: Warm and dry, no rashes. Cardiac: Regular rate and rhythm, no murmurs rubs or gallops, no lower extremity edema.  Respiratory: Clear to auscultation bilaterally. Not using accessory muscles, speaking in full sentences. Left foot: No visible erythema or swelling. Range of motion is full in all directions. Strength is 5/5 in all directions. No hallux valgus. No pes cavus or pes planus. No abnormal callus noted.  No pain over the navicular prominence, or base of fifth metatarsal. No tenderness to palpation of the calcaneal insertion of plantar fascia. No pain at the Achilles insertion. No pain over the calcaneal bursa. No pain of the retrocalcaneal bursa. No tenderness to palpation over the tarsals, metatarsals, or phalanges. No hallux rigidus or limitus. Tender palpation over the dorsal lateral midfoot proximal to the fourth metatarsal shaft No pain with compression of the metatarsal heads. Neurovascularly intact distally.  Procedure: Real-time Ultrasound Guided injection of the left dorsal midfoot intertarsal joints Device: GE Logiq E   Verbal informed consent obtained.  Time-out conducted.  Noted no overlying erythema, induration, or other signs of local infection.  Skin prepped in a sterile fashion.  Local anesthesia: Topical Ethyl chloride.  With sterile technique and under real time ultrasound guidance:  1 cc Kenalog 40, 1 cc lidocaine, 1 cc bupivacaine injected easily Completed without difficulty  Pain immediately resolved suggesting accurate placement of the medication.  Advised to call if fevers/chills, erythema, induration, drainage, or persistent bleeding.  Images permanently stored and available for review in the ultrasound unit.  Impression: Technically successful ultrasound guided injection.  Impression and Recommendations:    Primary osteoarthritis of both feet Left dorsal intertarsal joint injection as above, MRI showed osteoarthritis without evidence of stress injury. Return to see me in 1 month. If insufficient improvement we will get him to [facility] for orthotics.   ___________________________________________ Ihor Austinhomas J. Benjamin Stainhekkekandam, M.D., ABFM., CAQSM. Primary Care and Sports Medicine [facility]  Adjunct Professor of Family Medicine  [employer]

## 2018-10-19 NOTE — Assessment & Plan Note (Signed)
Left dorsal intertarsal joint injection as above, MRI showed osteoarthritis without evidence of stress injury. Return to see me in 1 month. If insufficient improvement we will get him to [facility] for orthotics.

## 2018-10-27 DIAGNOSIS — E119 Type 2 diabetes mellitus without complications: Secondary | ICD-10-CM | POA: Diagnosis not present

## 2018-10-27 DIAGNOSIS — G4733 Obstructive sleep apnea (adult) (pediatric): Secondary | ICD-10-CM | POA: Diagnosis not present

## 2018-10-27 DIAGNOSIS — J4520 Mild intermittent asthma, uncomplicated: Secondary | ICD-10-CM | POA: Diagnosis not present

## 2018-10-27 DIAGNOSIS — I480 Paroxysmal atrial fibrillation: Secondary | ICD-10-CM | POA: Diagnosis not present

## 2018-10-28 DIAGNOSIS — I498 Other specified cardiac arrhythmias: Secondary | ICD-10-CM | POA: Diagnosis not present

## 2018-11-12 ENCOUNTER — Other Ambulatory Visit: Payer: Self-pay | Admitting: Sports Medicine

## 2018-11-19 ENCOUNTER — Ambulatory Visit: Payer: BLUE CROSS/BLUE SHIELD | Admitting: Sports Medicine

## 2018-11-21 ENCOUNTER — Other Ambulatory Visit: Payer: Self-pay | Admitting: Sports Medicine

## 2018-11-22 MED ORDER — IPRATROPIUM BROMIDE 0.03 % NA SOLN: 1.0000 | Freq: Four times a day (QID) | NASAL | 1 refills | Status: AC

## 2018-11-22 NOTE — Addendum Note (Signed)
Addended by: Narda Rutherford on: 11/22/2018 11:11 AM   Modules accepted: Orders

## 2019-01-05 ENCOUNTER — Other Ambulatory Visit: Payer: Self-pay | Admitting: Sports Medicine

## 2019-02-03 ENCOUNTER — Other Ambulatory Visit: Payer: Self-pay | Admitting: Sports Medicine

## 2019-02-03 DIAGNOSIS — E291 Testicular hypofunction: Secondary | ICD-10-CM

## 2019-02-03 MED ORDER — TESTOSTERONE CYPIONATE 200 MG/ML IM SOLN: 200.0000 mg | INTRAMUSCULAR | 4 refills | Status: DC

## 2019-02-21 ENCOUNTER — Other Ambulatory Visit: Payer: Self-pay | Admitting: *Deleted

## 2019-02-21 DIAGNOSIS — G6289 Other specified polyneuropathies: Secondary | ICD-10-CM

## 2019-02-21 MED ORDER — GABAPENTIN 300 MG PO CAPS: ORAL_CAPSULE | ORAL | 1 refills | Status: DC

## 2019-03-31 ENCOUNTER — Other Ambulatory Visit: Payer: Self-pay | Admitting: Sports Medicine

## 2019-03-31 DIAGNOSIS — E119 Type 2 diabetes mellitus without complications: Secondary | ICD-10-CM

## 2019-04-12 ENCOUNTER — Encounter: Payer: Self-pay | Admitting: Sports Medicine

## 2019-04-12 ENCOUNTER — Ambulatory Visit: Payer: BC Managed Care – PPO | Admitting: Sports Medicine

## 2019-04-12 ENCOUNTER — Other Ambulatory Visit: Payer: Self-pay

## 2019-04-12 VITALS — BP 142/82 | HR 76 | Ht 70.0 in | Wt 223.0 lb

## 2019-04-12 DIAGNOSIS — L603 Nail dystrophy: Secondary | ICD-10-CM

## 2019-04-12 DIAGNOSIS — K429 Umbilical hernia without obstruction or gangrene: Secondary | ICD-10-CM | POA: Diagnosis not present

## 2019-04-12 DIAGNOSIS — E119 Type 2 diabetes mellitus without complications: Secondary | ICD-10-CM

## 2019-04-12 LAB — POCT GLYCOSYLATED HEMOGLOBIN (HGB A1C): Hemoglobin A1C: 5.6 % (ref 4.0–5.6)

## 2019-04-12 MED ORDER — HYDROCODONE-ACETAMINOPHEN 5-325 MG PO TABS: 1.0000 | ORAL_TABLET | Freq: Three times a day (TID) | ORAL | 0 refills | Status: DC | PRN

## 2019-04-12 NOTE — Patient Instructions (Signed)
Fingernail or Toenail Removal, Adult, Care After This sheet gives you information about how to care for yourself after your procedure. Your health care provider may also give you more specific instructions. If you have problems or questions, contact your health care provider. What can I expect after the procedure? After the procedure, it is common to have:  Pain.  Redness.  Swelling.  Soreness. Follow these instructions at home:  If you have a splint: ? Do not put pressure on any part of the splint until it is fully hardened. This may take several hours. ? Wear the splint as told by your health care provider. Remove it only as told by your health care provider. ? Loosen the splint if your fingers or toes tingle, become numb, or turn cold and blue. ? Keep the splint clean. ? If the splint is not waterproof:  Do not let it get wet.  Cover it with a watertight covering when you take a bath or a shower. Wound care   Follow instructions from your health care provider about how to take care of your wound. Make sure you: ? Wash your hands with soap and water before you change your bandage (dressing). If soap and water are not available, use hand sanitizer. ? Change your dressing as told by your health care provider. ? Keep your dressing dry until your health care provider says it can be removed. ? Leave stitches (sutures), skin glue, or adhesive strips in place. These skin closures may need to stay in place for 2 weeks or longer. If adhesive strip edges start to loosen and curl up, you may trim the loose edges. Do not remove adhesive strips completely unless your health care provider tells you to do that.  Check your wound every day for signs of infection. Check for: ? More redness, swelling, or pain. ? More fluid or blood. ? Warmth. ? Pus or a bad smell. Managing pain, stiffness, and swelling  Move your fingers or toes often to avoid stiffness and to lessen swelling.  Raise  (elevate) the injured area above the level of your heart while you are sitting or lying down. You may need to keep your finger or toe raised or supported on a pillow for 24 hours or as told by your health care provider.  Soak your hand or foot in warm, soapy water for 10-20 minutes, 3 times a day or as told by your health care provider. Medicine  Take over-the-counter and prescription medicines only as told by your health care provider.  If you were prescribed an antibiotic medicine, use it as told by your health care provider. Do not stop using the antibiotic even if your condition improves. General instructions  If you were given a shoe to wear, wear it as told by your health care provider.  Keep all follow-up visits as told by your health care provider. This is important. Contact a health care provider if:  You have more redness, swelling, or pain around your wound.  You have more fluid or blood coming from your wound.  Your wound feels warm to the touch.  You have pus or a bad smell coming from your wound.  You have a fever.  Your finger or toe looks blue or black. This information is not intended to replace advice given to you by your health care provider. Make sure you discuss any questions you have with your health care provider. Document Released: 06/16/2014 Document Revised: 05/08/2017 Document Reviewed: 12/03/2015 Elsevier Patient   Education  2020 Elsevier Inc.  

## 2019-04-12 NOTE — Assessment & Plan Note (Signed)
Well-controlled, hemoglobin A1c is 5.6% on XigDuo, no changes.

## 2019-04-12 NOTE — Progress Notes (Signed)
Subjective:    CC: Ingrown R great toenail  HPI: Rawlins is a pleasant 58 year old with a history of bilateral ingrown toe nails treated with lateral removal and phenol matricectomy. The R nail proceeded to regrow into the lateral toe and has since become infected and drained puss. He also reports that the nail has thickened and turned yellow.  I reviewed the past medical history, family history, social history, surgical history, and allergies today and no changes were needed.  Please see the problem list section below in epic for further details.  Past Medical History: Past Medical History:  Diagnosis Date  . Allergy   . Atrial fibrillation ([place])   . Bulging lumbar disc   . OSA (obstructive sleep apnea)    Past Surgical History: Past Surgical History:  Procedure Laterality Date  . KNEE ARTHROPLASTY    . NASAL SEPTUM SURGERY    . VASECTOMY     Social History: Social History   Socioeconomic History  . Marital status: Married    Spouse name: Not on file  . Number of children: Not on file  . Years of education: Not on file  . Highest education level: Not on file  Occupational History  . Not on file  Social Needs  . Financial resource strain: Not on file  . Food insecurity    Worry: Not on file    Inability: Not on file  . Transportation needs    Medical: Not on file    Non-medical: Not on file  Tobacco Use  . Smoking status: Never Smoker  . Smokeless tobacco: Never Used  Substance and Sexual Activity  . Alcohol use: No  . Drug use: No  . Sexual activity: Yes  Lifestyle  . Physical activity    Days per week: Not on file    Minutes per session: Not on file  . Stress: Not on file  Relationships  . Social Herbalist on phone: Not on file    Gets together: Not on file    Attends religious service: Not on file    Active member of club or organization: Not on file    Attends meetings of clubs or organizations: Not on file    Relationship status: Not on  file  Other Topics Concern  . Not on file  Social History Narrative  . Not on file   Family History: Family History  Problem Relation Age of Onset  . Heart disease Mother   . Stroke Paternal Grandmother    Allergies: Allergies  Allergen Reactions  . Penicillins    Medications: See med rec.  Review of Systems: No fevers, chills, night sweats, weight loss, chest pain, or shortness of breath.   Objective:    General: Well Developed, well nourished, and in no acute distress.  Neuro: Alert and oriented x3, extra-ocular muscles intact, sensation grossly intact.  HEENT: Normocephalic, atraumatic, pupils equal round reactive to light..  Skin: Warm and dry, no rashes. Cardiac: Regular rate and rhythm, no lower extremity edema.  Respiratory: Not using accessory muscles, speaking in full sentences.  R Foot: Oncomycosis of the great toenail. Erythema and edema of the distal great toe. Great toenail grown into nail bed on lateral aspect of toe.  A/P: Karon has previously had several lateral nail removals with phenol matricectomy his R great toenail was colonized by fungus and grew into the nail bed which showed signs of infection. Due to the recurrent nature of his nail problems the  toenail was removed and a phenol matricectomy was performed under local block with 10cc of 1`% lidocaine. The procedure was technically successful.   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 Return to see me in 2 weeks for wound check. Hydrocodone for postoperative pain.   Umbilical hernia Referral to general surgery.  Controlled type 2 diabetes mellitus without complication, without long-term current use of insulin (HCC) Well-controlled, hemoglobin A1c is 5.6% on XigDuo, no changes.   ___________________________________________ Ihor Austin. Benjamin Stain, M.D., ABFM., CAQSM. Primary Care and Sports  Medicine [facility]  Adjunct Professor of Family Medicine  [employer]

## 2019-04-12 NOTE — Assessment & Plan Note (Signed)
Referral to general surgery

## 2019-04-12 NOTE — Assessment & Plan Note (Signed)
Refractory to several treatments, nail plate removal with nailbed nail matricectomy. Return to see me in 2 weeks for wound check. Hydrocodone for postoperative pain.

## 2019-04-26 ENCOUNTER — Ambulatory Visit (INDEPENDENT_AMBULATORY_CARE_PROVIDER_SITE_OTHER): Payer: BC Managed Care – PPO

## 2019-04-26 ENCOUNTER — Encounter: Payer: Self-pay | Admitting: Sports Medicine

## 2019-04-26 ENCOUNTER — Other Ambulatory Visit: Payer: Self-pay

## 2019-04-26 ENCOUNTER — Ambulatory Visit (INDEPENDENT_AMBULATORY_CARE_PROVIDER_SITE_OTHER): Payer: BC Managed Care – PPO | Admitting: Sports Medicine

## 2019-04-26 DIAGNOSIS — S60112A Contusion of left thumb with damage to nail, initial encounter: Secondary | ICD-10-CM | POA: Diagnosis not present

## 2019-04-26 DIAGNOSIS — S6702XA Crushing injury of left thumb, initial encounter: Secondary | ICD-10-CM

## 2019-04-26 DIAGNOSIS — L600 Ingrowing nail: Secondary | ICD-10-CM

## 2019-04-26 DIAGNOSIS — S60012A Contusion of left thumb without damage to nail, initial encounter: Secondary | ICD-10-CM | POA: Diagnosis not present

## 2019-04-26 NOTE — Progress Notes (Signed)
Subjective:    CC: Follow-up toenail removal, crush injury of the thumb  HPI: William Ortega is a pleasant 58 year old who was seen 2 weeks ago for a R great toenail removal and matrixectomy. He reports mild pain that is brief and intermittent but uncertain if this is due to his diabetic neuropathy. He has kept the toe wrapped and has been using a topical triple antibiotic. Additionally, he is being seen for a subungual hematoma from closing his L thumb in a drawer earlier today and he has moderate pain in the thumb.   I reviewed the past medical history, family history, social history, surgical history, and allergies today and no changes were needed.  Please see the problem list section below in epic for further details.  Past Medical History: Past Medical History:  Diagnosis Date  . Allergy   . Atrial fibrillation (HCC)   . Bulging lumbar disc   . OSA (obstructive sleep apnea)    Past Surgical History: Past Surgical History:  Procedure Laterality Date  . KNEE ARTHROPLASTY    . NASAL SEPTUM SURGERY    . VASECTOMY     Social History: Social History   Socioeconomic History  . Marital status: Married    Spouse name: Not on file  . Number of children: Not on file  . Years of education: Not on file  . Highest education level: Not on file  Occupational History  . Not on file  Social Needs  . Financial resource strain: Not on file  . Food insecurity    Worry: Not on file    Inability: Not on file  . Transportation needs    Medical: Not on file    Non-medical: Not on file  Tobacco Use  . Smoking status: Never Smoker  . Smokeless tobacco: Never Used  Substance and Sexual Activity  . Alcohol use: No  . Drug use: No  . Sexual activity: Yes  Lifestyle  . Physical activity    Days per week: Not on file    Minutes per session: Not on file  . Stress: Not on file  Relationships  . Social Musician on phone: Not on file    Gets together: Not on file    Attends religious  service: Not on file    Active member of club or organization: Not on file    Attends meetings of clubs or organizations: Not on file    Relationship status: Not on file  Other Topics Concern  . Not on file  Social History Narrative  . Not on file   Family History: Family History  Problem Relation Age of Onset  . Heart disease Mother   . Stroke Paternal Grandmother    Allergies: Allergies  Allergen Reactions  . Penicillins    Medications: See med rec.  Review of Systems: No fevers, chills, night sweats, weight loss, chest pain, or shortness of breath.   Objective:    General: Well Developed, well nourished, and in no acute distress.  Neuro: Alert and oriented x3, extra-ocular muscles intact, sensation grossly intact.  HEENT: Normocephalic, atraumatic, pupils equal round reactive to light. Skin: Warm and dry, no rashes. Cardiac: Regular rate and rhythm, no lower extremity edema.  Respiratory: Not using accessory muscles, speaking in full sentences. L thumb: subungual hematoma present under nail plate, lunula visualized. Tender to palpation over nail and the distal phalanx.  R great toe: Granulation tissue and skin blanching around nail matrix.  A/P: William Ortega is  healing well from his R great toe nail plate removal with phenol matricectomy. Advised that whitening of the skin surrounding matrix is due to chronic moisture to the area. His thumb was braced in a stack splint and he was sent for an x-ray to rule out fracture. Follow-up will be in 4 weeks and he will be contacted with the results from his imaging.   Impression and Recommendations:    Ingrown toenail of right foot Does do extremely well post nail plate removal with phenol matricectomy, no further intervention needed.  Crushing injury of left thumb There is a very small subungual hematoma but not enough to consider trephination. I applied a staxx splint, x-rays, follow-up will depend on x-ray results.    ___________________________________________ Gwen Her. Dianah Field, M.D., ABFM., CAQSM. Primary Care and Sports Medicine [facility]  Adjunct Professor of [place] [employer]

## 2019-04-26 NOTE — Assessment & Plan Note (Signed)
There is a very small subungual hematoma but not enough to consider trephination. I applied a staxx splint, x-rays, follow-up will depend on x-ray results.

## 2019-04-26 NOTE — Assessment & Plan Note (Signed)
Does do extremely well post nail plate removal with phenol matricectomy, no further intervention needed.

## 2019-05-24 ENCOUNTER — Ambulatory Visit: Payer: BC Managed Care – PPO | Admitting: Sports Medicine

## 2019-06-22 ENCOUNTER — Other Ambulatory Visit: Payer: Self-pay | Admitting: Sports Medicine

## 2019-06-22 DIAGNOSIS — E291 Testicular hypofunction: Secondary | ICD-10-CM

## 2019-06-22 MED ORDER — TESTOSTERONE CYPIONATE 200 MG/ML IM SOLN: 200.0000 mg | INTRAMUSCULAR | 0 refills | Status: DC

## 2019-07-05 ENCOUNTER — Other Ambulatory Visit: Payer: Self-pay | Admitting: Sports Medicine

## 2019-07-05 DIAGNOSIS — E291 Testicular hypofunction: Secondary | ICD-10-CM

## 2019-07-06 ENCOUNTER — Other Ambulatory Visit: Payer: Self-pay | Admitting: Sports Medicine

## 2019-07-06 DIAGNOSIS — E291 Testicular hypofunction: Secondary | ICD-10-CM

## 2019-07-06 DIAGNOSIS — E119 Type 2 diabetes mellitus without complications: Secondary | ICD-10-CM

## 2019-07-07 ENCOUNTER — Telehealth: Payer: Self-pay | Admitting: Sports Medicine

## 2019-07-07 NOTE — Telephone Encounter (Signed)
Patient wants labs put in, he wants to be to go over the results at his physical.

## 2019-07-08 NOTE — Telephone Encounter (Signed)
Labs already placed. Pt can come get fasting labs at his convenience

## 2019-07-08 NOTE — Telephone Encounter (Signed)
Yep, ordered those 2 days ago.  Preesh.

## 2019-07-15 ENCOUNTER — Encounter: Payer: BC Managed Care – PPO | Admitting: Sports Medicine

## 2019-07-18 ENCOUNTER — Encounter: Payer: BC Managed Care – PPO | Admitting: Sports Medicine

## 2019-07-27 ENCOUNTER — Ambulatory Visit (INDEPENDENT_AMBULATORY_CARE_PROVIDER_SITE_OTHER): Payer: BC Managed Care – PPO | Admitting: Sports Medicine

## 2019-07-27 ENCOUNTER — Encounter: Payer: Self-pay | Admitting: Sports Medicine

## 2019-07-27 ENCOUNTER — Other Ambulatory Visit: Payer: Self-pay

## 2019-07-27 DIAGNOSIS — M1A0690 Idiopathic chronic gout, unspecified knee, without tophus (tophi): Secondary | ICD-10-CM | POA: Diagnosis not present

## 2019-07-27 DIAGNOSIS — Z0000 Encounter for general adult medical examination without abnormal findings: Secondary | ICD-10-CM | POA: Diagnosis not present

## 2019-07-27 DIAGNOSIS — E291 Testicular hypofunction: Secondary | ICD-10-CM

## 2019-07-27 DIAGNOSIS — I10 Essential (primary) hypertension: Secondary | ICD-10-CM

## 2019-07-27 DIAGNOSIS — E119 Type 2 diabetes mellitus without complications: Secondary | ICD-10-CM

## 2019-07-27 MED ORDER — TESTOSTERONE CYPIONATE 200 MG/ML IM SOLN: 200.0000 mg | INTRAMUSCULAR | 0 refills | Status: DC

## 2019-07-27 NOTE — Assessment & Plan Note (Signed)
Annual physical as above today. He is up-to-date on screening measures with the exception of colon cancer.

## 2019-07-27 NOTE — Assessment & Plan Note (Signed)
Rechecking hemoglobin A1c 

## 2019-07-27 NOTE — Assessment & Plan Note (Signed)
William Ortega has noted fantastic improvements in his energy, mood on testosterone supplementation. He has not had a shot in over 2 weeks so we will check his testosterone levels today but he understands that they may look abnormally low.

## 2019-07-27 NOTE — Assessment & Plan Note (Signed)
Blood pressure is not at goal, he will cut back sodium and check blood pressures at home over the next week. We will either increase his metoprolol or add lisinopril for renal protection should continue to be elevated.

## 2019-07-27 NOTE — Progress Notes (Addendum)
Subjective:    CC: Annual Physical Exam  HPI:  This patient is here for their annual physical  I reviewed the past medical history, family history, social history, surgical history, and allergies today and no changes were needed.  Please see the problem list section below in epic for further details.  Past Medical History: Past Medical History:  Diagnosis Date  . Allergy   . Atrial fibrillation (HCC)   . Bulging lumbar disc   . OSA (obstructive sleep apnea)    Past Surgical History: Past Surgical History:  Procedure Laterality Date  . KNEE ARTHROPLASTY    . NASAL SEPTUM SURGERY    . VASECTOMY     Social History: Social History   Socioeconomic History  . Marital status: Married    Spouse name: Not on file  . Number of children: Not on file  . Years of education: Not on file  . Highest education level: Not on file  Occupational History  . Not on file  Tobacco Use  . Smoking status: Never Smoker  . Smokeless tobacco: Never Used  Substance and Sexual Activity  . Alcohol use: No  . Drug use: No  . Sexual activity: Yes  Other Topics Concern  . Not on file  Social History Narrative  . Not on file   Social Determinants of Health   Financial Resource Strain:   . Difficulty of Paying Living Expenses: Not on file  Food Insecurity:   . Worried About Programme researcher, broadcasting/film/video in the Last Year: Not on file  . Ran Out of Food in the Last Year: Not on file  Transportation Needs:   . Lack of Transportation (Medical): Not on file  . Lack of Transportation (Non-Medical): Not on file  Physical Activity:   . Days of Exercise per Week: Not on file  . Minutes of Exercise per Session: Not on file  Stress:   . Feeling of Stress : Not on file  Social Connections:   . Frequency of Communication with Friends and Family: Not on file  . Frequency of Social Gatherings with Friends and Family: Not on file  . Attends Religious Services: Not on file  . Active Member of Clubs or  Organizations: Not on file  . Attends Banker Meetings: Not on file  . Marital Status: Not on file   Family History: Family History  Problem Relation Age of Onset  . Heart disease Mother   . Stroke Paternal Grandmother    Allergies: Allergies  Allergen Reactions  . Penicillins    Medications: See med rec.  Review of Systems: No headache, visual changes, nausea, vomiting, diarrhea, constipation, dizziness, abdominal pain, skin rash, fevers, chills, night sweats, swollen lymph nodes, weight loss, chest pain, body aches, joint swelling, muscle aches, shortness of breath, mood changes, visual or auditory hallucinations.  Objective:    General: Well Developed, well nourished, and in no acute distress.  Neuro: Alert and oriented x3, extra-ocular muscles intact, sensation grossly intact. Cranial nerves II through XII are intact, motor, sensory, and coordinative functions are all intact. HEENT: Normocephalic, atraumatic, pupils equal round reactive to light, neck supple, no masses, no lymphadenopathy, thyroid nonpalpable. Oropharynx, nasopharynx, external ear canals are unremarkable. Skin: Warm and dry, no rashes noted.  Cardiac: Regular rate and rhythm, no murmurs rubs or gallops.  Respiratory: Clear to auscultation bilaterally. Not using accessory muscles, speaking in full sentences.  Abdominal: Soft, nontender, nondistended, positive bowel sounds, no masses, no organomegaly.  Musculoskeletal: Shoulder, elbow,  wrist, hip, knee, ankle stable, and with full range of motion.  Impression and Recommendations:    The patient was counselled, risk factors were discussed, anticipatory guidance given.  Annual physical exam Annual physical as above today. He is up-to-date on screening measures with the exception of colon cancer.  Controlled type 2 diabetes mellitus without complication, without long-term current use of insulin (HCC) Rechecking hemoglobin A1c.  Gout Rechecking  uric acid levels  Hypogonadism male [place] has noted fantastic improvements in his energy, mood on testosterone supplementation. He has not had a shot in over 2 weeks so we will check his testosterone levels today but he understands that they may look abnormally low.  Benign essential hypertension Blood pressure is not at goal, he will cut back sodium and check blood pressures at home over the next week. We will either increase his metoprolol or add lisinopril for renal protection should continue to be elevated.   ___________________________________________ Gwen Her. Dianah Field, M.D., ABFM., CAQSM. Primary Care and Sports Medicine [facility]  Adjunct Professor of [place] [employer]

## 2019-07-27 NOTE — Assessment & Plan Note (Signed)
Rechecking uric acid levels. 

## 2019-07-28 ENCOUNTER — Ambulatory Visit: Payer: BC Managed Care – PPO | Admitting: Sports Medicine

## 2019-08-01 LAB — LIPID PANEL W/REFLEX DIRECT LDL
Cholesterol: 183 mg/dL (ref <200)
HDL: 37 mg/dL — ABNORMAL LOW (ref >=40)
LDL Cholesterol (Calc): 118 mg/dL (calc) — ABNORMAL HIGH
Non-HDL Cholesterol (Calc): 146 mg/dL (calc) — ABNORMAL HIGH (ref <130)
Total CHOL/HDL Ratio: 4.9 (calc) (ref <5.0)
Triglycerides: 167 mg/dL — ABNORMAL HIGH (ref <150)

## 2019-08-01 LAB — COMPLETE METABOLIC PANEL WITH GFR
AG Ratio: 2.2 (calc) (ref 1.0–2.5)
ALT: 50 U/L — ABNORMAL HIGH (ref 9–46)
AST: 34 U/L (ref 10–35)
Albumin: 4.6 g/dL (ref 3.6–5.1)
Alkaline phosphatase (APISO): 50 U/L (ref 35–144)
BUN: 25 mg/dL (ref 7–25)
CO2: 27 mmol/L (ref 20–32)
Calcium: 9.3 mg/dL (ref 8.6–10.3)
Chloride: 101 mmol/L (ref 98–110)
Creat: 0.93 mg/dL (ref 0.70–1.33)
GFR, Est African American: 106 mL/min/{1.73_m2} (ref >=60)
GFR, Est Non African American: 91 mL/min/{1.73_m2} (ref >=60)
Globulin: 2.1 g/dL (calc) (ref 1.9–3.7)
Glucose, Bld: 116 mg/dL — ABNORMAL HIGH (ref 65–99)
Potassium: 4.5 mmol/L (ref 3.5–5.3)
Sodium: 139 mmol/L (ref 135–146)
Total Bilirubin: 1.1 mg/dL (ref 0.2–1.2)
Total Protein: 6.7 g/dL (ref 6.1–8.1)

## 2019-08-01 LAB — HEMOGLOBIN A1C
Hgb A1c MFr Bld: 6 % of total Hgb — ABNORMAL HIGH (ref <5.7)
Mean Plasma Glucose: 126 (calc)
eAG (mmol/L): 7 (calc)

## 2019-08-01 LAB — PSA, TOTAL AND FREE
PSA, % Free: 25 % (calc) — ABNORMAL LOW (ref >25)
PSA, Free: 0.4 ng/mL
PSA, Total: 1.6 ng/mL (ref <=4.0)

## 2019-08-01 LAB — CBC
HCT: 47.8 % (ref 38.5–50.0)
Hemoglobin: 17 g/dL (ref 13.2–17.1)
MCH: 33 pg (ref 27.0–33.0)
MCHC: 35.6 g/dL (ref 32.0–36.0)
MCV: 92.8 fL (ref 80.0–100.0)
MPV: 10.7 fL (ref 7.5–12.5)
Platelets: 145 10*3/uL (ref 140–400)
RBC: 5.15 10*6/uL (ref 4.20–5.80)
RDW: 12.2 % (ref 11.0–15.0)
WBC: 5.7 10*3/uL (ref 3.8–10.8)

## 2019-08-01 LAB — TSH: TSH: 2 mIU/L (ref 0.40–4.50)

## 2019-08-01 LAB — TESTOSTERONE, FREE & TOTAL
Free Testosterone: 24.5 pg/mL — ABNORMAL LOW (ref 35.0–155.0)
Free Testosterone: 24.5 pg/mL — ABNORMAL LOW (ref 35.0–155.0)
Testosterone, Total, LC-MS-MS: 115 ng/dL — ABNORMAL LOW (ref 250–1100)
Testosterone, Total, LC-MS-MS: 111 ng/dL — ABNORMAL LOW (ref 250–1100)

## 2019-08-01 LAB — URIC ACID: Uric Acid, Serum: 3 mg/dL — ABNORMAL LOW (ref 4.0–8.0)

## 2019-08-01 LAB — MICROALBUMIN / CREATININE URINE RATIO
Creatinine, Urine: 56 mg/dL (ref 20–320)
Microalb, Ur: <0.2 mg/dL

## 2019-08-05 DIAGNOSIS — Z9989 Dependence on other enabling machines and devices: Secondary | ICD-10-CM | POA: Diagnosis not present

## 2019-08-05 DIAGNOSIS — G4733 Obstructive sleep apnea (adult) (pediatric): Secondary | ICD-10-CM | POA: Diagnosis not present

## 2019-08-05 DIAGNOSIS — E785 Hyperlipidemia, unspecified: Secondary | ICD-10-CM | POA: Diagnosis not present

## 2019-08-05 DIAGNOSIS — Z6832 Body mass index (BMI) 32.0-32.9, adult: Secondary | ICD-10-CM | POA: Diagnosis not present

## 2019-08-07 DIAGNOSIS — Z0000 Encounter for general adult medical examination without abnormal findings: Secondary | ICD-10-CM | POA: Diagnosis not present

## 2019-08-09 ENCOUNTER — Other Ambulatory Visit: Payer: Self-pay | Admitting: Sports Medicine

## 2019-08-12 ENCOUNTER — Other Ambulatory Visit: Payer: Self-pay | Admitting: Sports Medicine

## 2019-08-12 DIAGNOSIS — E291 Testicular hypofunction: Secondary | ICD-10-CM

## 2019-08-12 DIAGNOSIS — G4733 Obstructive sleep apnea (adult) (pediatric): Secondary | ICD-10-CM | POA: Diagnosis not present

## 2019-08-12 LAB — COLOGUARD
COLOGUARD: NEGATIVE
Cologuard: NEGATIVE

## 2019-08-17 ENCOUNTER — Other Ambulatory Visit: Payer: Self-pay | Admitting: Sports Medicine

## 2019-08-18 ENCOUNTER — Encounter: Payer: Self-pay | Admitting: Sports Medicine

## 2019-08-18 ENCOUNTER — Other Ambulatory Visit: Payer: Self-pay | Admitting: Sports Medicine

## 2019-08-18 DIAGNOSIS — G6289 Other specified polyneuropathies: Secondary | ICD-10-CM

## 2019-08-26 ENCOUNTER — Other Ambulatory Visit: Payer: Self-pay | Admitting: Sports Medicine

## 2019-08-26 ENCOUNTER — Other Ambulatory Visit: Payer: Self-pay | Admitting: *Deleted

## 2019-08-26 DIAGNOSIS — E291 Testicular hypofunction: Secondary | ICD-10-CM

## 2019-08-26 MED ORDER — TESTOSTERONE CYPIONATE 200 MG/ML IM SOLN: 200.0000 mg | INTRAMUSCULAR | 0 refills | Status: DC

## 2019-09-07 ENCOUNTER — Other Ambulatory Visit: Payer: Self-pay | Admitting: Sports Medicine

## 2019-09-07 DIAGNOSIS — E291 Testicular hypofunction: Secondary | ICD-10-CM

## 2019-09-08 ENCOUNTER — Other Ambulatory Visit: Payer: Self-pay

## 2019-09-08 DIAGNOSIS — E291 Testicular hypofunction: Secondary | ICD-10-CM

## 2019-09-23 ENCOUNTER — Other Ambulatory Visit: Payer: Self-pay | Admitting: Sports Medicine

## 2019-09-23 DIAGNOSIS — E119 Type 2 diabetes mellitus without complications: Secondary | ICD-10-CM

## 2019-09-24 ENCOUNTER — Other Ambulatory Visit: Payer: Self-pay | Admitting: Sports Medicine

## 2019-09-24 DIAGNOSIS — E291 Testicular hypofunction: Secondary | ICD-10-CM

## 2019-10-05 ENCOUNTER — Other Ambulatory Visit: Payer: Self-pay | Admitting: Sports Medicine

## 2019-10-05 DIAGNOSIS — E291 Testicular hypofunction: Secondary | ICD-10-CM

## 2019-10-23 ENCOUNTER — Other Ambulatory Visit: Payer: Self-pay | Admitting: Sports Medicine

## 2019-10-23 DIAGNOSIS — E291 Testicular hypofunction: Secondary | ICD-10-CM

## 2019-11-09 DIAGNOSIS — G4733 Obstructive sleep apnea (adult) (pediatric): Secondary | ICD-10-CM | POA: Diagnosis not present

## 2019-11-09 DIAGNOSIS — R000 Tachycardia, unspecified: Secondary | ICD-10-CM | POA: Diagnosis not present

## 2019-11-09 DIAGNOSIS — I480 Paroxysmal atrial fibrillation: Secondary | ICD-10-CM | POA: Diagnosis not present

## 2019-11-09 DIAGNOSIS — Z9989 Dependence on other enabling machines and devices: Secondary | ICD-10-CM | POA: Diagnosis not present

## 2019-11-10 DIAGNOSIS — I480 Paroxysmal atrial fibrillation: Secondary | ICD-10-CM | POA: Diagnosis not present

## 2019-11-10 DIAGNOSIS — G4733 Obstructive sleep apnea (adult) (pediatric): Secondary | ICD-10-CM | POA: Diagnosis not present

## 2019-11-10 DIAGNOSIS — R000 Tachycardia, unspecified: Secondary | ICD-10-CM | POA: Diagnosis not present

## 2019-11-12 ENCOUNTER — Other Ambulatory Visit: Payer: Self-pay | Admitting: Sports Medicine

## 2019-11-29 IMAGING — DX DG FINGER THUMB 2+V*L*
3 series · 3 of 3 positions shown · non-contrast
Comparison: Left wrist radiograph 12/08/2016

CLINICAL DATA: Subungual hematoma, evaluate for distal phalanx
fracture. Left thumb injury, smashed thumb between 2 pieces of metal
today.

EXAM:
LEFT THUMB 2+V

[finger ap]
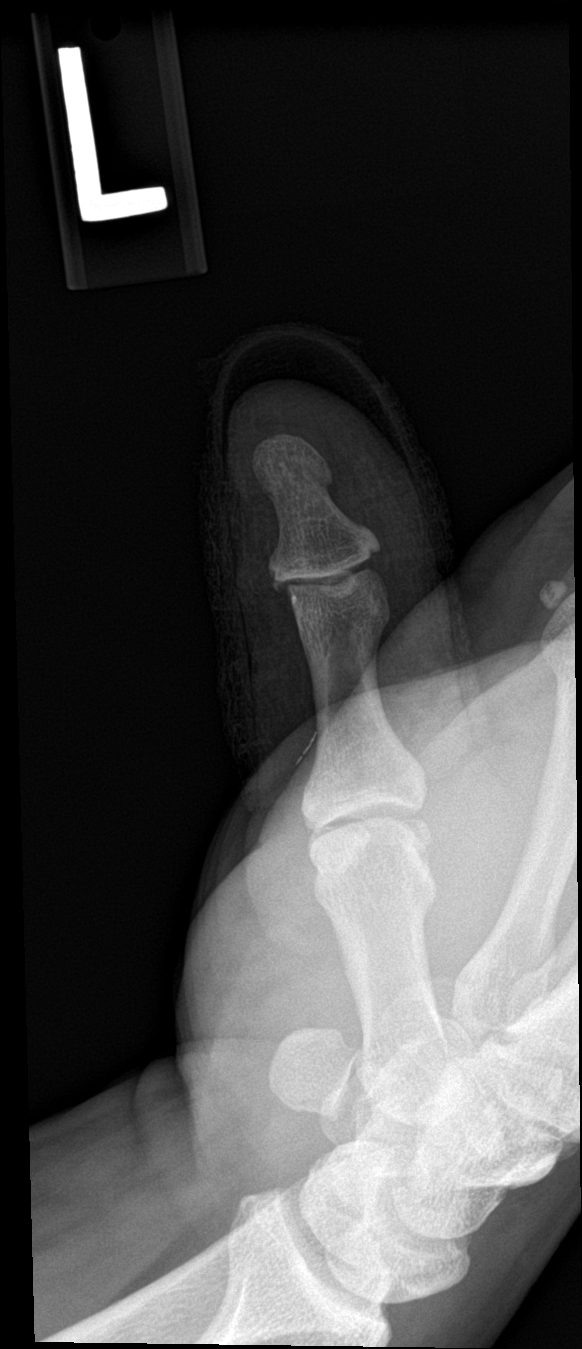

[finger obl]
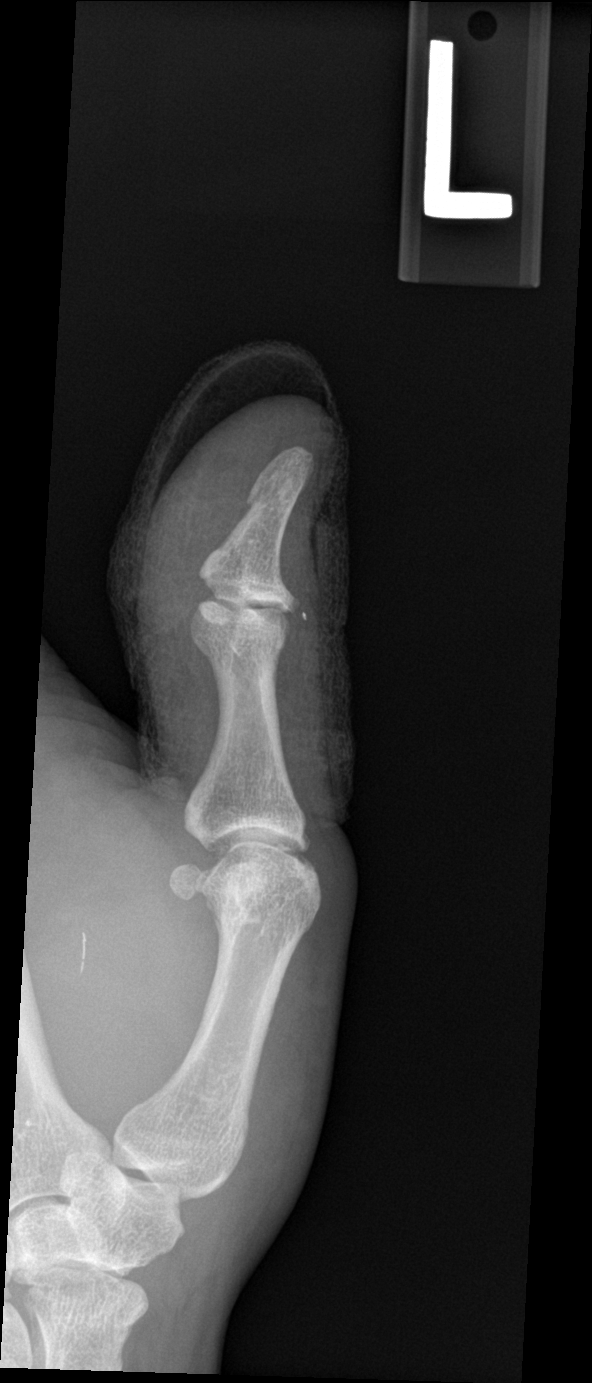

[finger lat]
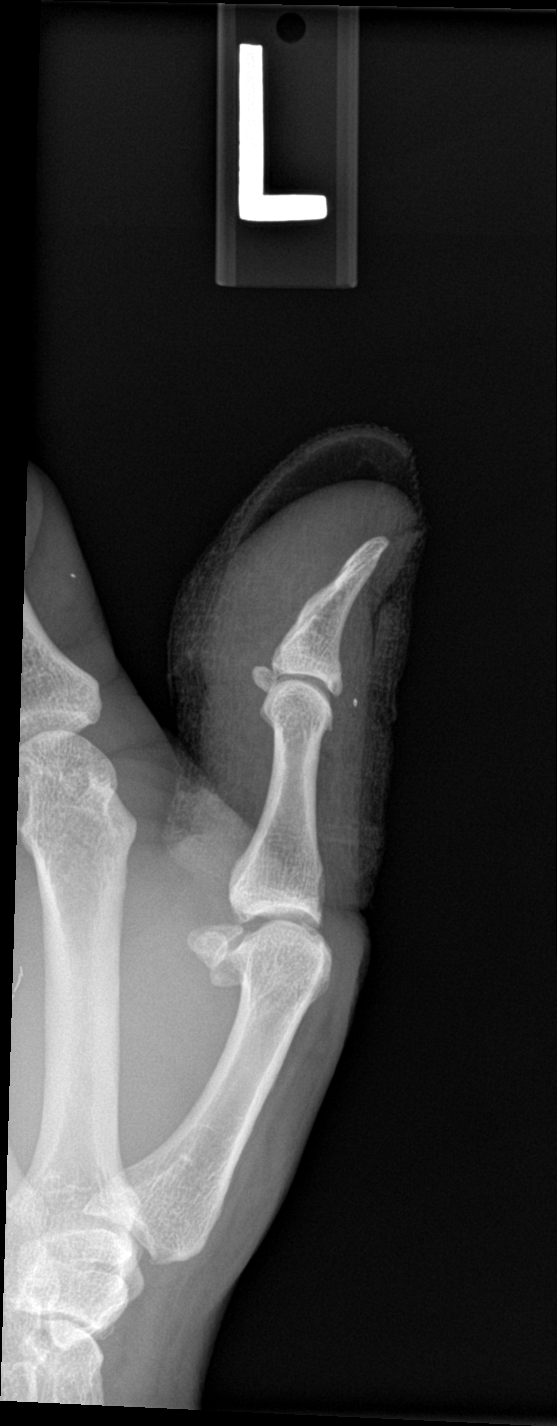

[3 of 3 positions shown; findings below may reference images not displayed]

FINDINGS: There is no evidence of fracture or dislocation. Minimal
degenerative change of the metacarpal phalangeal joint and
interphalangeal joint. Dressing overlies the thumb. Punctate
radiopaque density in the thumb soft tissue and thenar eminence are
chronic and unchanged from prior.
IMPRESSION: No fracture or subluxation of the thumb.  Mild osteoarthritis.

## 2019-12-27 ENCOUNTER — Other Ambulatory Visit: Payer: Self-pay | Admitting: Sports Medicine

## 2019-12-27 ENCOUNTER — Other Ambulatory Visit: Payer: Self-pay | Admitting: *Deleted

## 2019-12-27 DIAGNOSIS — E291 Testicular hypofunction: Secondary | ICD-10-CM

## 2020-01-24 ENCOUNTER — Encounter: Payer: Self-pay | Admitting: Sports Medicine

## 2020-01-24 ENCOUNTER — Ambulatory Visit: Payer: BC Managed Care – PPO | Admitting: Sports Medicine

## 2020-01-24 ENCOUNTER — Other Ambulatory Visit: Payer: Self-pay

## 2020-01-24 VITALS — BP 121/78 | HR 73 | Ht 70.0 in | Wt 228.0 lb

## 2020-01-24 DIAGNOSIS — Z0000 Encounter for general adult medical examination without abnormal findings: Secondary | ICD-10-CM

## 2020-01-24 DIAGNOSIS — E291 Testicular hypofunction: Secondary | ICD-10-CM

## 2020-01-24 DIAGNOSIS — I10 Essential (primary) hypertension: Secondary | ICD-10-CM

## 2020-01-24 DIAGNOSIS — E119 Type 2 diabetes mellitus without complications: Secondary | ICD-10-CM | POA: Diagnosis not present

## 2020-01-24 DIAGNOSIS — Z23 Encounter for immunization: Secondary | ICD-10-CM

## 2020-01-24 LAB — POCT GLYCOSYLATED HEMOGLOBIN (HGB A1C): Hemoglobin A1C: 5.9 % — AB (ref 4.0–5.6)

## 2020-01-24 MED ORDER — TESTOSTERONE CYPIONATE 200 MG/ML IM SOLN: 200.0000 mg | INTRAMUSCULAR | 0 refills | Status: DC

## 2020-01-24 MED ORDER — "BD DISP NEEDLE 23G X 1"" MISC": 8 refills | Status: DC

## 2020-01-24 MED ORDER — "BD LUER-LOK SYRINGE 18G X 1-1/2"" 3 ML MISC": 0 refills | Status: DC

## 2020-01-24 NOTE — Assessment & Plan Note (Signed)
Annual physical can be scheduled in February of next year. He is up-to-date on his screening measures.

## 2020-01-24 NOTE — Assessment & Plan Note (Signed)
Hemoglobin A1c =5.9%, well controlled, no changes.

## 2020-01-24 NOTE — Assessment & Plan Note (Signed)
William Ortega noted fantastic improvements in mood, energy on testosterone supplementation, he was having some difficulty with having to get a new vial every 2 weeks from the pharmacy, so we gave him a good Rx coupon for a 10 mL vial and refilled his needles and syringes. Historically his blood checks have shown very low T, we will probably check them again during his physical exam, but holding off on aggressive changes now as he does feel so much better.

## 2020-01-24 NOTE — Assessment & Plan Note (Signed)
Under better control now, it sounds as though he did cut back on some of his sodium.

## 2020-01-24 NOTE — Progress Notes (Signed)
    Procedures performed today:    None.  Independent interpretation of notes and tests performed by another provider:   None.  Brief History, Exam, Impression, and Recommendations:    Annual physical exam Annual physical can be scheduled in February of next year. He is up-to-date on his screening measures.  Benign essential hypertension Under better control now, it sounds as though he did cut back on some of his sodium.  Controlled type 2 diabetes mellitus without complication, without long-term current use of insulin (HCC) Hemoglobin A1c =5.9%, well controlled, no changes.  Hypogonadism male [name] noted fantastic improvements in mood, energy on testosterone supplementation, he was having some difficulty with having to get a new vial every 2 weeks from the pharmacy, so we gave him a good Rx coupon for a 10 mL vial and refilled his needles and syringes. Historically his blood checks have shown very low T, we will probably check them again during his physical exam, but holding off on aggressive changes now as he does feel so much better.    ___________________________________________ William Ortega. William Ortega, M.D., ABFM., CAQSM. Primary Care and Sports Medicine [facility]  Adjunct Instructor of Family Medicine  [employer]

## 2020-02-08 DIAGNOSIS — G4733 Obstructive sleep apnea (adult) (pediatric): Secondary | ICD-10-CM | POA: Diagnosis not present

## 2020-03-26 ENCOUNTER — Other Ambulatory Visit: Payer: Self-pay | Admitting: Sports Medicine

## 2020-03-26 DIAGNOSIS — E119 Type 2 diabetes mellitus without complications: Secondary | ICD-10-CM

## 2020-04-06 ENCOUNTER — Encounter: Payer: Self-pay | Admitting: Nurse Practitioner

## 2020-04-06 ENCOUNTER — Ambulatory Visit (INDEPENDENT_AMBULATORY_CARE_PROVIDER_SITE_OTHER): Payer: BC Managed Care – PPO | Admitting: Nurse Practitioner

## 2020-04-06 VITALS — BP 139/90 | HR 70 | Temp 98.1°F | Ht 70.0 in | Wt 225.2 lb

## 2020-04-06 DIAGNOSIS — S90451A Superficial foreign body, right great toe, initial encounter: Secondary | ICD-10-CM | POA: Diagnosis not present

## 2020-04-06 DIAGNOSIS — E291 Testicular hypofunction: Secondary | ICD-10-CM | POA: Diagnosis not present

## 2020-04-06 DIAGNOSIS — L089 Local infection of the skin and subcutaneous tissue, unspecified: Secondary | ICD-10-CM

## 2020-04-06 MED ORDER — DOXYCYCLINE HYCLATE 100 MG PO TABS: 100.0000 mg | ORAL_TABLET | Freq: Two times a day (BID) | ORAL | 0 refills | Status: DC

## 2020-04-06 MED ORDER — "BD LUER-LOK SYRINGE 18G X 1-1/2"" 3 ML MISC": 0 refills | Status: DC

## 2020-04-06 MED ORDER — TESTOSTERONE CYPIONATE 200 MG/ML IM SOLN: 200.0000 mg | INTRAMUSCULAR | 0 refills | Status: DC

## 2020-04-06 MED ORDER — "BD DISP NEEDLE 23G X 1"" MISC": 8 refills | Status: DC

## 2020-04-06 NOTE — Progress Notes (Signed)
Acute Office Visit  Subjective:    Patient ID: William Ortega, male    DOB: 1961/05/23, 59 y.o.   MRN: [id number]  Chief Complaint  Patient presents with  . Toe Pain    right great toe, had nail removed a year ago, is regrowing and is painful, redness around nail bed  . Hypogonadism    HPI William Ortega is a 59 year old male presenting today with concerns of his right great toenail.  He reports approximately a year ago the toenail was ingrown and he had it removed in this office.  Since that time the toenail has been slowly growing back however there is some deformity noted to the toenail with areas of thickness.  He reports that yesterday he noticed that his great toe was sore and appear to be red.  He tells me that his wife looked at it and was concerned that the toenail was growing back into the nailbed.  He does have a long history of diabetes and subsequently has peripheral neuropathy due to this and therefore he does have limited sensation in his feet and he tells me it is difficult for him to pinpoint exactly where the pain is.  He does report that he was working out in the yard earlier this week and got into some thorny bushes which did stick him in the legs and he suspects the top of the right great toe also as there are 2 dark spots.  He also reports that a thorn did get stuck in his finger and subsequently festered up, turned red, was very warm and painful.  He removed that thorn and the wound has healed.   He denies any fever, chills, nausea, vomiting, diarrhea.  He denies red streaks in his foot or near the toe.  He denies any purulent drainage from the toe.    Is also requesting a refill on his testosterone.  At his last visit he was provided with a 23-month supply he was told to follow-up in February for labs.  He reports that he is doing quite well on the current dose and would like to continue this.  Past Medical History:  Diagnosis Date  . Allergy   . Atrial fibrillation (HCC)   .  Bulging lumbar disc   . OSA (obstructive sleep apnea)     Past Surgical History:  Procedure Laterality Date  . KNEE ARTHROPLASTY    . NASAL SEPTUM SURGERY    . VASECTOMY      Family History  Problem Relation Age of Onset  . Heart disease Mother   . Stroke Paternal Grandmother     Social History   Socioeconomic History  . Marital status: Married    Spouse name: Not on file  . Number of children: Not on file  . Years of education: Not on file  . Highest education level: Not on file  Occupational History  . Not on file  Tobacco Use  . Smoking status: Never Smoker  . Smokeless tobacco: Never Used  Substance and Sexual Activity  . Alcohol use: No  . Drug use: No  . Sexual activity: Yes  Other Topics Concern  . Not on file  Social History Narrative  . Not on file   Social Determinants of Health   Financial Resource Strain:   . Difficulty of Paying Living Expenses: Not on file  Food Insecurity:   . Worried About Programme researcher, broadcasting/film/video in the Last Year: Not on file  . Ran Out  of Food in the Last Year: Not on file  Transportation Needs:   . Lack of Transportation (Medical): Not on file  . Lack of Transportation (Non-Medical): Not on file  Physical Activity:   . Days of Exercise per Week: Not on file  . Minutes of Exercise per Session: Not on file  Stress:   . Feeling of Stress : Not on file  Social Connections:   . Frequency of Communication with Friends and Family: Not on file  . Frequency of Social Gatherings with Friends and Family: Not on file  . Attends Religious Services: Not on file  . Active Member of Clubs or Organizations: Not on file  . Attends Banker Meetings: Not on file  . Marital Status: Not on file  Intimate Partner Violence:   . Fear of Current or Ex-Partner: Not on file  . Emotionally Abused: Not on file  . Physically Abused: Not on file  . Sexually Abused: Not on file    Outpatient Medications Prior to Visit  Medication Sig  Dispense Refill  . albuterol (PROVENTIL HFA;VENTOLIN HFA) 108 (90 Base) MCG/ACT inhaler Inhale 2 puffs into the lungs every 6 (six) hours as needed for wheezing. 2 Inhaler 11  . allopurinol (ZYLOPRIM) 300 MG tablet TAKE 1 TABLET BY MOUTH TWICE A DAY 180 tablet 2  . AMBULATORY NON FORMULARY MEDICATION Knee-high, medium compression, graduated compression stockings. Apply to lower extremities. 1 each 0  . aspirin EC 81 MG tablet Take 1 tablet (81 mg total) by mouth daily. 90 tablet 3  . diclofenac (VOLTAREN) 75 MG EC tablet TAKE 1 TABLET BY MOUTH TWICE A DAY 180 tablet 0  . flecainide (TAMBOCOR) 50 MG tablet Take 25 mg by mouth 2 (two) times daily.    Marland Kitchen gabapentin (NEURONTIN) 300 MG capsule Take 1 capsule (300 mg total) by mouth 3 (three) times daily. TAKE 1 CAPSULE BY MOUTH AT BEDTIME X 3 DAYS, 1 CAPSULE TWICE A DAY THEN 1 CAPSULE 3 TIMES A DAY 270 capsule 1  . ipratropium (ATROVENT) 0.03 % nasal spray Place 1 spray into both nostrils 4 (four) times daily. (Patient taking differently: Place 1 spray into both nostrils daily as needed. ) 30 mL 1  . metoprolol succinate (TOPROL-XL) 25 MG 24 hr tablet Take 1 tablet (25 mg total) by mouth daily. 30 tablet 2  . traMADol (ULTRAM) 50 MG tablet Take 1 tablet (50 mg total) by mouth every 8 (eight) hours as needed for moderate pain. Maximum 6 tabs per day. 21 tablet 0  . triamcinolone ointment (KENALOG) 0.5 % APPLY 1 APPLICATION TOPICALLY 2 (TWO) TIMES DAILY. TO AFFECTED AREA, AVOID EYES AND FACE 30 g 3  . XIGDUO XR 03-999 MG TB24 TAKE 1 TABLET BY MOUTH EVERY DAY 30 tablet 5  . NEEDLE, DISP, 23 G (BD DISP NEEDLE) 23G X 1" MISC USE AS DIRECTED 12 each 8  . SYRINGE-NEEDLE, DISP, 3 ML (B-D 3CC LUER-LOK SYR 18GX1-1/2) 18G X 1-1/2" 3 ML MISC USE AS DIRECTED 100 each 0  . testosterone cypionate (DEPOTESTOSTERONE CYPIONATE) 200 MG/ML injection Inject 1 mL (200 mg total) into the muscle every 14 (fourteen) days. 10 mL 0  . metFORMIN (GLUCOPHAGE) 500 MG tablet Take 1  tablet by mouth daily with breakfast. (Patient not taking: Reported on 04/06/2020)     No facility-administered medications prior to visit.    Allergies  Allergen Reactions  . Penicillins Rash    Review of Systems All review of systems negative except what  is listed in the HPI    Objective:    Physical Exam Vitals and nursing note reviewed.  Constitutional:      Appearance: Normal appearance.  HENT:     Head: Normocephalic.  Eyes:     Extraocular Movements: Extraocular movements intact.     Conjunctiva/sclera: Conjunctivae normal.     Pupils: Pupils are equal, round, and reactive to light.  Cardiovascular:     Rate and Rhythm: Normal rate and regular rhythm.     Pulses: Normal pulses.          Dorsalis pedis pulses are 2+ on the right side and 2+ on the left side.  Pulmonary:     Effort: Pulmonary effort is normal.  Musculoskeletal:        General: Normal range of motion.     Cervical back: Normal range of motion.     Right lower leg: No edema.     Left lower leg: No edema.       Feet:  Feet:     Right foot:     Skin integrity: Erythema and warmth present. No skin breakdown, callus or dry skin.     Toenail Condition: Right toenails are abnormally thick.     Left foot:     Skin integrity: Skin breakdown present. No erythema, warmth, callus or dry skin.     Toenail Condition: Left toenails are normal.     Comments: Right great toe cleansed with chlorohexidine solution and allowed to dry. Sterile forceps utilized to remove two approximate 69mm splintered thorns from the dorsal surface of the toe.  Skin:    General: Skin is warm and dry.     Capillary Refill: Capillary refill takes less than 2 seconds.     Findings: Erythema present.  Neurological:     General: No focal deficit present.     Mental Status: He is alert and oriented to person, place, and time.     Sensory: Sensory deficit present.     Coordination: Coordination normal.     Gait: Gait normal.      Comments: Decreased sensation noted bilaterally on the feet with greatest involvement of the toes bilaterally.   Psychiatric:        Mood and Affect: Mood normal.        Behavior: Behavior normal.        Thought Content: Thought content normal.        Judgment: Judgment normal.     BP 139/90   Pulse 70   Temp 98.1 F (36.7 C) (Oral)   Ht 5\' 10"  (1.778 m)   Wt 225 lb 3.2 oz (102.2 kg)   SpO2 98%   BMI 32.31 kg/m  Wt Readings from Last 3 Encounters:  04/06/20 225 lb 3.2 oz (102.2 kg)  01/24/20 228 lb (103.4 kg)  07/27/19 222 lb (100.7 kg)    Health Maintenance Due  Topic Date Due  . COVID-19 Vaccine (1) Never done  . INFLUENZA VACCINE  01/08/2020    There are no preventive care reminders to display for this patient.   Lab Results  Component Value Date   TSH 2.00 07/27/2019   Lab Results  Component Value Date   WBC 5.7 07/27/2019   HGB 17.0 07/27/2019   HCT 47.8 07/27/2019   MCV 92.8 07/27/2019   PLT 145 07/27/2019   Lab Results  Component Value Date   NA 139 07/27/2019   K 4.5 07/27/2019   CO2 27 07/27/2019   GLUCOSE  116 (H) 07/27/2019   BUN 25 07/27/2019   CREATININE 0.93 07/27/2019   BILITOT 1.1 07/27/2019   ALKPHOS 52 03/07/2016   AST 34 07/27/2019   ALT 50 (H) 07/27/2019   PROT 6.7 07/27/2019   ALBUMIN 4.5 03/07/2016   CALCIUM 9.3 07/27/2019   ANIONGAP 8 06/06/2014   Lab Results  Component Value Date   CHOL 183 07/27/2019   Lab Results  Component Value Date   HDL 37 (L) 07/27/2019   Lab Results  Component Value Date   LDLCALC 118 (H) 07/27/2019   Lab Results  Component Value Date   TRIG 167 (H) 07/27/2019   Lab Results  Component Value Date   CHOLHDL 4.9 07/27/2019   Lab Results  Component Value Date   HGBA1C 5.9 (A) 01/24/2020       Assessment & Plan:   Problem List Items Addressed This Visit      Endocrine   Hypogonadism male    Based on previous assessment and plan from PCP refill provided today for 7522-month supply of  testosterone.  Refills also provided for needles today. Patient is to follow-up with PCP in February of this year or in physical exam where labs will be drawn. Recommend follow-up with PCP sooner if needed.      Relevant Medications   testosterone cypionate (DEPOTESTOSTERONE CYPIONATE) 200 MG/ML injection   NEEDLE, DISP, 23 G (BD DISP NEEDLE) 23G X 1" MISC   SYRINGE-NEEDLE, DISP, 3 ML (B-D 3CC LUER-LOK SYR 18GX1-1/2) 18G X 1-1/2" 3 ML MISC     Other   Foreign body of great toe of right foot with infection - Primary    Symptoms and presentation consistent with suspected infection of right great toe after removal of two splintered thorns. Plan to treat with oral doxycycline given history of diabetes and high risk for worsening infection. Recommend patient soak foot in warm Epson salt water daily and dry foot thoroughly.  Monitor daily for signs of worsening infection, purulent drainage, redness, or warmth.  Follow-up immediately if any signs of worsening infection or nonhealing wounds present.      Relevant Medications   doxycycline (VIBRA-TABS) 100 MG tablet       Meds ordered this encounter  Medications  . testosterone cypionate (DEPOTESTOSTERONE CYPIONATE) 200 MG/ML injection    Sig: Inject 1 mL (200 mg total) into the muscle every 14 (fourteen) days.    Dispense:  10 mL    Refill:  0    Please provide 3 month supply the best way insurance coverage will allow.  . doxycycline (VIBRA-TABS) 100 MG tablet    Sig: Take 1 tablet (100 mg total) by mouth 2 (two) times daily.    Dispense:  14 tablet    Refill:  0  . NEEDLE, DISP, 23 G (BD DISP NEEDLE) 23G X 1" MISC    Sig: USE AS DIRECTED    Dispense:  12 each    Refill:  8  . SYRINGE-NEEDLE, DISP, 3 ML (B-D 3CC LUER-LOK SYR 18GX1-1/2) 18G X 1-1/2" 3 ML MISC    Sig: USE AS DIRECTED    Dispense:  100 each    Refill:  0     Tollie EthSara E. Chia Mowers, NP

## 2020-04-06 NOTE — Assessment & Plan Note (Signed)
Symptoms and presentation consistent with suspected infection of right great toe after removal of two splintered thorns. Plan to treat with oral doxycycline given history of diabetes and high risk for worsening infection. Recommend patient soak foot in warm Epson salt water daily and dry foot thoroughly.  Monitor daily for signs of worsening infection, purulent drainage, redness, or warmth.  Follow-up immediately if any signs of worsening infection or nonhealing wounds present.

## 2020-04-06 NOTE — Assessment & Plan Note (Addendum)
Based on previous assessment and plan from PCP refill provided today for 30-month supply of testosterone.  Refills also provided for needles today. Patient is to follow-up with PCP in February of this year or in physical exam where labs will be drawn. Recommend follow-up with PCP sooner if needed.

## 2020-04-06 NOTE — Patient Instructions (Signed)
Watch the toe carefully and keep me posted if it changes at all .

## 2020-04-16 ENCOUNTER — Other Ambulatory Visit: Payer: Self-pay | Admitting: Sports Medicine

## 2020-04-17 ENCOUNTER — Other Ambulatory Visit: Payer: Self-pay | Admitting: Sports Medicine

## 2020-04-17 ENCOUNTER — Telehealth: Payer: Self-pay | Admitting: *Deleted

## 2020-04-17 DIAGNOSIS — E291 Testicular hypofunction: Secondary | ICD-10-CM

## 2020-04-17 NOTE — Telephone Encounter (Signed)
Testosterone approved through Cover [employer] Med[employer]. Coverage Start Date:03/18/2020;Coverage End Date:04/17/2021. Pharmacy notified.

## 2020-04-30 ENCOUNTER — Other Ambulatory Visit: Payer: Self-pay | Admitting: Sports Medicine

## 2020-05-11 DIAGNOSIS — G4733 Obstructive sleep apnea (adult) (pediatric): Secondary | ICD-10-CM | POA: Diagnosis not present

## 2020-05-18 ENCOUNTER — Encounter: Payer: Self-pay | Admitting: Sports Medicine

## 2020-05-18 ENCOUNTER — Ambulatory Visit: Payer: BC Managed Care – PPO | Admitting: Sports Medicine

## 2020-05-18 ENCOUNTER — Other Ambulatory Visit: Payer: Self-pay

## 2020-05-18 DIAGNOSIS — L603 Nail dystrophy: Secondary | ICD-10-CM

## 2020-05-18 NOTE — Progress Notes (Signed)
    Procedures performed today:    None.  Independent interpretation of notes and tests performed by another provider:   None.  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 He did have some increasing pain, he saw one of my partners who prescribed amount of biotics appropriately, it does not look infected today, there was a shard of toenail growing into his hyponychium, this was removed. The nail is growing back in spite of phenol, it is somewhat dystrophic but we are going to give it another 6 months to a year before considering repeat nail plate removal and matricectomy.    ___________________________________________ Ihor Austin. Benjamin Stain, M.D., ABFM., CAQSM. Primary Care and Sports Medicine [place] [facility]  Adjunct Instructor of Family Medicine  [employer]

## 2020-05-18 NOTE — Assessment & Plan Note (Addendum)
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 He did have some increasing pain, he saw one of my partners who prescribed amount of biotics appropriately, it does not look infected today, there was a shard of toenail growing into his hyponychium, this was removed. The nail is growing back in spite of phenol, it is somewhat dystrophic but we are going to give it another 6 months to a year before considering repeat nail plate removal and matricectomy.

## 2020-05-30 DIAGNOSIS — G4733 Obstructive sleep apnea (adult) (pediatric): Secondary | ICD-10-CM | POA: Diagnosis not present

## 2020-05-30 DIAGNOSIS — E119 Type 2 diabetes mellitus without complications: Secondary | ICD-10-CM | POA: Diagnosis not present

## 2020-05-30 DIAGNOSIS — I480 Paroxysmal atrial fibrillation: Secondary | ICD-10-CM | POA: Diagnosis not present

## 2020-05-30 DIAGNOSIS — Z9989 Dependence on other enabling machines and devices: Secondary | ICD-10-CM | POA: Diagnosis not present

## 2020-07-08 ENCOUNTER — Other Ambulatory Visit: Payer: Self-pay | Admitting: Sports Medicine

## 2020-07-08 DIAGNOSIS — E119 Type 2 diabetes mellitus without complications: Secondary | ICD-10-CM

## 2020-07-09 ENCOUNTER — Other Ambulatory Visit: Payer: Self-pay | Admitting: Nurse Practitioner

## 2020-07-09 DIAGNOSIS — E291 Testicular hypofunction: Secondary | ICD-10-CM

## 2020-07-24 ENCOUNTER — Other Ambulatory Visit: Payer: Self-pay | Admitting: Sports Medicine

## 2020-07-27 ENCOUNTER — Encounter: Payer: BC Managed Care – PPO | Admitting: Sports Medicine

## 2020-08-07 ENCOUNTER — Other Ambulatory Visit: Payer: Self-pay | Admitting: Sports Medicine

## 2020-08-07 DIAGNOSIS — E559 Vitamin D deficiency, unspecified: Secondary | ICD-10-CM

## 2020-08-07 DIAGNOSIS — E291 Testicular hypofunction: Secondary | ICD-10-CM

## 2020-08-07 DIAGNOSIS — E119 Type 2 diabetes mellitus without complications: Secondary | ICD-10-CM

## 2020-08-07 DIAGNOSIS — M1A0690 Idiopathic chronic gout, unspecified knee, without tophus (tophi): Secondary | ICD-10-CM

## 2020-08-07 DIAGNOSIS — G6289 Other specified polyneuropathies: Secondary | ICD-10-CM

## 2020-08-09 DIAGNOSIS — G4733 Obstructive sleep apnea (adult) (pediatric): Secondary | ICD-10-CM | POA: Diagnosis not present

## 2020-08-17 ENCOUNTER — Other Ambulatory Visit: Payer: Self-pay

## 2020-08-17 ENCOUNTER — Ambulatory Visit (INDEPENDENT_AMBULATORY_CARE_PROVIDER_SITE_OTHER): Payer: BC Managed Care – PPO | Admitting: Sports Medicine

## 2020-08-17 VITALS — BP 138/100 | HR 86 | Ht 70.0 in | Wt 230.0 lb

## 2020-08-17 DIAGNOSIS — G6289 Other specified polyneuropathies: Secondary | ICD-10-CM | POA: Diagnosis not present

## 2020-08-17 DIAGNOSIS — E119 Type 2 diabetes mellitus without complications: Secondary | ICD-10-CM

## 2020-08-17 DIAGNOSIS — E559 Vitamin D deficiency, unspecified: Secondary | ICD-10-CM | POA: Diagnosis not present

## 2020-08-17 DIAGNOSIS — E7800 Pure hypercholesterolemia, unspecified: Secondary | ICD-10-CM | POA: Diagnosis not present

## 2020-08-17 DIAGNOSIS — E291 Testicular hypofunction: Secondary | ICD-10-CM | POA: Diagnosis not present

## 2020-08-17 DIAGNOSIS — Z0000 Encounter for general adult medical examination without abnormal findings: Secondary | ICD-10-CM | POA: Diagnosis not present

## 2020-08-17 DIAGNOSIS — I10 Essential (primary) hypertension: Secondary | ICD-10-CM

## 2020-08-17 LAB — POCT UA - MICROALBUMIN
Albumin/Creatinine Ratio, Urine, POC: <30
Creatinine, POC: 100 mg/dL
Microalbumin Ur, POC: 10 mg/L

## 2020-08-17 NOTE — Assessment & Plan Note (Signed)
Blood pressure is uncontrolled today, typically it runs in the normal range, he did not take his antihypertensives today as he was fasting for labs. He will take his blood pressures at home and send me a diary over the next week or so.

## 2020-08-17 NOTE — Progress Notes (Addendum)
  Subjective:    CC: Annual Physical Exam  HPI:  This patient is here for their annual physical  I reviewed the past medical history, family history, social history, surgical history, and allergies today and no changes were needed.  Please see the problem list section below in epic for further details.  Past Medical History: Past Medical History:  Diagnosis Date  . Allergy   . Atrial fibrillation (HCC)   . Bulging lumbar disc   . OSA (obstructive sleep apnea)    Past Surgical History: Past Surgical History:  Procedure Laterality Date  . KNEE ARTHROPLASTY    . NASAL SEPTUM SURGERY    . VASECTOMY     Social History: Social History   Socioeconomic History  . Marital status: Married    Spouse name: Not on file  . Number of children: Not on file  . Years of education: Not on file  . Highest education level: Not on file  Occupational History  . Not on file  Tobacco Use  . Smoking status: Never Smoker  . Smokeless tobacco: Never Used  Substance and Sexual Activity  . Alcohol use: No  . Drug use: No  . Sexual activity: Yes  Other Topics Concern  . Not on file  Social History Narrative  . Not on file   Social Determinants of Health   Financial Resource Strain: Not on file  Food Insecurity: Not on file  Transportation Needs: Not on file  Physical Activity: Not on file  Stress: Not on file  Social Connections: Not on file   Family History: Family History  Problem Relation Age of Onset  . Heart disease Mother   . Stroke Paternal Grandmother    Allergies: Allergies  Allergen Reactions  . Penicillins Rash   Medications: See med rec.  Review of Systems: No headache, visual changes, nausea, vomiting, diarrhea, constipation, dizziness, abdominal pain, skin rash, fevers, chills, night sweats, swollen lymph nodes, weight loss, chest pain, body aches, joint swelling, muscle aches, shortness of breath, mood changes, visual or auditory hallucinations.  Objective:     General: Well Developed, well nourished, and in no acute distress.  Neuro: Alert and oriented x3, extra-ocular muscles intact, sensation grossly intact. Cranial nerves II through XII are intact, motor, sensory, and coordinative functions are all intact. HEENT: Normocephalic, atraumatic, pupils equal round reactive to light, neck supple, no masses, no lymphadenopathy, thyroid nonpalpable. Oropharynx, nasopharynx, external ear canals are unremarkable. Skin: Warm and dry, no rashes noted.  Cardiac: Regular rate and rhythm, no murmurs rubs or gallops.  Respiratory: Clear to auscultation bilaterally. Not using accessory muscles, speaking in full sentences.  Abdominal: Soft, nontender, nondistended, positive bowel sounds, no masses, no organomegaly.  Musculoskeletal: Shoulder, elbow, wrist, hip, knee, ankle stable, and with full range of motion.  Impression and Recommendations:    The patient was counselled, risk factors were discussed, anticipatory guidance given.  Annual physical exam Annual physical as above.  Benign essential hypertension Blood pressure is uncontrolled today, typically it runs in the normal range, he did not take his antihypertensives today as he was fasting for labs. He will take his blood pressures at home and send me a diary over the next week or so.  Hyperlipidemia Starting rosuvastatin 10, recheck lipids in 2 to 3 months.   ___________________________________________ Ihor Austin. Benjamin Stain, M.D., ABFM., CAQSM. Primary Care and Sports Medicine [place] [facility]  Adjunct Professor of Family Medicine  [employer]

## 2020-08-17 NOTE — Assessment & Plan Note (Signed)
Annual physical as above.  

## 2020-08-20 LAB — TESTOSTERONE, FREE & TOTAL
Free Testosterone: 102.4 pg/mL (ref 35.0–155.0)
Testosterone, Total, LC-MS-MS: 411 ng/dL (ref 250–1100)

## 2020-08-20 LAB — CBC
HCT: 48 % (ref 38.5–50.0)
Hemoglobin: 16.9 g/dL (ref 13.2–17.1)
MCH: 33.5 pg — ABNORMAL HIGH (ref 27.0–33.0)
MCHC: 35.2 g/dL (ref 32.0–36.0)
MCV: 95 fL (ref 80.0–100.0)
MPV: 10.5 fL (ref 7.5–12.5)
Platelets: 142 10*3/uL (ref 140–400)
RBC: 5.05 10*6/uL (ref 4.20–5.80)
RDW: 12.4 % (ref 11.0–15.0)
WBC: 5.4 10*3/uL (ref 3.8–10.8)

## 2020-08-20 LAB — COMPREHENSIVE METABOLIC PANEL
AG Ratio: 2.5 (calc) (ref 1.0–2.5)
ALT: 55 U/L — ABNORMAL HIGH (ref 9–46)
AST: 33 U/L (ref 10–35)
Albumin: 4.5 g/dL (ref 3.6–5.1)
Alkaline phosphatase (APISO): 48 U/L (ref 35–144)
BUN: 24 mg/dL (ref 7–25)
CO2: 29 mmol/L (ref 20–32)
Calcium: 9.2 mg/dL (ref 8.6–10.3)
Chloride: 102 mmol/L (ref 98–110)
Creat: 0.91 mg/dL (ref 0.70–1.33)
Globulin: 1.8 g/dL (calc) — ABNORMAL LOW (ref 1.9–3.7)
Glucose, Bld: 142 mg/dL — ABNORMAL HIGH (ref 65–99)
Potassium: 4.2 mmol/L (ref 3.5–5.3)
Sodium: 139 mmol/L (ref 135–146)
Total Bilirubin: 1 mg/dL (ref 0.2–1.2)
Total Protein: 6.3 g/dL (ref 6.1–8.1)

## 2020-08-20 LAB — LIPID PANEL
Cholesterol: 158 mg/dL (ref <200)
HDL: 31 mg/dL — ABNORMAL LOW (ref >=40)
LDL Cholesterol (Calc): 95 mg/dL (calc)
Non-HDL Cholesterol (Calc): 127 mg/dL (calc) (ref <130)
Total CHOL/HDL Ratio: 5.1 (calc) — ABNORMAL HIGH (ref <5.0)
Triglycerides: 234 mg/dL — ABNORMAL HIGH (ref <150)

## 2020-08-20 LAB — VITAMIN D 25 HYDROXY (VIT D DEFICIENCY, FRACTURES): Vit D, 25-Hydroxy: 50 ng/mL (ref 30–100)

## 2020-08-20 LAB — TSH: TSH: 3.52 mIU/L (ref 0.40–4.50)

## 2020-08-20 LAB — VITAMIN B12: Vitamin B-12: 1372 pg/mL — ABNORMAL HIGH (ref 200–1100)

## 2020-08-20 LAB — HEMOGLOBIN A1C
Hgb A1c MFr Bld: 5.9 % of total Hgb — ABNORMAL HIGH (ref <5.7)
Mean Plasma Glucose: 123 mg/dL
eAG (mmol/L): 6.8 mmol/L

## 2020-08-20 MED ORDER — ROSUVASTATIN CALCIUM 10 MG PO TABS: 10.0000 mg | ORAL_TABLET | Freq: Every day | ORAL | 3 refills | Status: DC

## 2020-08-20 NOTE — Assessment & Plan Note (Signed)
Starting rosuvastatin 10, recheck lipids in 2 to 3 months.

## 2020-08-20 NOTE — Addendum Note (Signed)
Addended by: Monica Becton on: 08/20/2020 04:23 PM   Modules accepted: Orders

## 2020-08-23 ENCOUNTER — Other Ambulatory Visit: Payer: Self-pay | Admitting: Sports Medicine

## 2020-08-23 DIAGNOSIS — G6289 Other specified polyneuropathies: Secondary | ICD-10-CM

## 2020-10-02 DIAGNOSIS — M545 Low back pain, unspecified: Secondary | ICD-10-CM | POA: Diagnosis not present

## 2020-10-02 DIAGNOSIS — M5136 Other intervertebral disc degeneration, lumbar region: Secondary | ICD-10-CM | POA: Diagnosis not present

## 2020-10-19 DIAGNOSIS — M5136 Other intervertebral disc degeneration, lumbar region: Secondary | ICD-10-CM | POA: Diagnosis not present

## 2020-10-20 ENCOUNTER — Other Ambulatory Visit: Payer: Self-pay | Admitting: Sports Medicine

## 2020-10-24 DIAGNOSIS — M545 Low back pain, unspecified: Secondary | ICD-10-CM | POA: Diagnosis not present

## 2020-11-07 DIAGNOSIS — G4733 Obstructive sleep apnea (adult) (pediatric): Secondary | ICD-10-CM | POA: Diagnosis not present

## 2020-11-20 ENCOUNTER — Other Ambulatory Visit: Payer: Self-pay

## 2020-11-20 ENCOUNTER — Encounter: Payer: Self-pay | Admitting: Sports Medicine

## 2020-11-20 ENCOUNTER — Ambulatory Visit: Payer: BC Managed Care – PPO | Admitting: Sports Medicine

## 2020-11-20 VITALS — BP 131/84 | HR 72 | Ht 70.0 in | Wt 225.0 lb

## 2020-11-20 DIAGNOSIS — Z23 Encounter for immunization: Secondary | ICD-10-CM | POA: Diagnosis not present

## 2020-11-20 DIAGNOSIS — Q828 Other specified congenital malformations of skin: Secondary | ICD-10-CM

## 2020-11-20 DIAGNOSIS — Z0000 Encounter for general adult medical examination without abnormal findings: Secondary | ICD-10-CM

## 2020-11-20 DIAGNOSIS — E119 Type 2 diabetes mellitus without complications: Secondary | ICD-10-CM | POA: Diagnosis not present

## 2020-11-20 NOTE — Assessment & Plan Note (Signed)
Cryotherapy of #5 skin tags on the face.

## 2020-11-20 NOTE — Addendum Note (Signed)
Addended by: Carolin Coy on: 11/20/2020 04:31 PM   Modules accepted: Orders

## 2020-11-20 NOTE — Assessment & Plan Note (Signed)
Shingrix #2 today

## 2020-11-20 NOTE — Progress Notes (Signed)
    Procedures performed today:    Procedure:  Cryodestruction of #5 skin tag, 4 on the left face and 1 on the right Consent obtained and verified. Time-out conducted. Noted no overlying erythema, induration, or other signs of local infection. Completed without difficulty using Cryo-Gun. Advised to call if fevers/chills, erythema, induration, drainage, or persistent bleeding.  Independent interpretation of notes and tests performed by another provider:   None.  Brief History, Exam, Impression, and Recommendations:    Accessory skin tags Cryotherapy of #5 skin tags on the face.  Annual physical exam Shingrix #2 today.  Controlled type 2 diabetes mellitus without complication, without long-term current use of insulin (HCC) A1c 5.9% a few months ago, I really do not think we need to check this for another 6 months, he is up-to-date on screening, he got his diabetic eye exam last month.    ___________________________________________ William Ortega. Benjamin Stain, M.D., ABFM., CAQSM. Primary Care and Sports Medicine  [facility]  Adjunct Instructor of Family Medicine  [employer]

## 2020-11-20 NOTE — Assessment & Plan Note (Signed)
A1c 5.9% a few months ago, I really do not think we need to check this for another 6 months, he is up-to-date on screening, he got his diabetic eye exam last month.

## 2020-12-18 ENCOUNTER — Ambulatory Visit: Payer: BC Managed Care – PPO | Admitting: Sports Medicine

## 2020-12-24 ENCOUNTER — Other Ambulatory Visit: Payer: Self-pay | Admitting: Sports Medicine

## 2020-12-24 DIAGNOSIS — E291 Testicular hypofunction: Secondary | ICD-10-CM

## 2020-12-26 DIAGNOSIS — G4733 Obstructive sleep apnea (adult) (pediatric): Secondary | ICD-10-CM | POA: Diagnosis not present

## 2020-12-26 DIAGNOSIS — I480 Paroxysmal atrial fibrillation: Secondary | ICD-10-CM | POA: Diagnosis not present

## 2020-12-26 DIAGNOSIS — J4520 Mild intermittent asthma, uncomplicated: Secondary | ICD-10-CM | POA: Diagnosis not present

## 2020-12-26 DIAGNOSIS — E119 Type 2 diabetes mellitus without complications: Secondary | ICD-10-CM | POA: Diagnosis not present

## 2021-01-01 ENCOUNTER — Other Ambulatory Visit: Payer: Self-pay | Admitting: Sports Medicine

## 2021-01-09 ENCOUNTER — Other Ambulatory Visit: Payer: Self-pay | Admitting: Sports Medicine

## 2021-01-09 DIAGNOSIS — E119 Type 2 diabetes mellitus without complications: Secondary | ICD-10-CM

## 2021-01-10 DIAGNOSIS — M5136 Other intervertebral disc degeneration, lumbar region: Secondary | ICD-10-CM | POA: Diagnosis not present

## 2021-01-10 DIAGNOSIS — M5126 Other intervertebral disc displacement, lumbar region: Secondary | ICD-10-CM | POA: Diagnosis not present

## 2021-01-25 DIAGNOSIS — M5136 Other intervertebral disc degeneration, lumbar region: Secondary | ICD-10-CM | POA: Diagnosis not present

## 2021-01-25 DIAGNOSIS — M5126 Other intervertebral disc displacement, lumbar region: Secondary | ICD-10-CM | POA: Diagnosis not present

## 2021-01-31 ENCOUNTER — Other Ambulatory Visit: Payer: Self-pay | Admitting: Sports Medicine

## 2021-02-04 DIAGNOSIS — M5416 Radiculopathy, lumbar region: Secondary | ICD-10-CM | POA: Diagnosis not present

## 2021-02-04 DIAGNOSIS — M4316 Spondylolisthesis, lumbar region: Secondary | ICD-10-CM | POA: Diagnosis not present

## 2021-02-05 DIAGNOSIS — G4733 Obstructive sleep apnea (adult) (pediatric): Secondary | ICD-10-CM | POA: Diagnosis not present

## 2021-02-07 DIAGNOSIS — M5416 Radiculopathy, lumbar region: Secondary | ICD-10-CM | POA: Diagnosis not present

## 2021-02-28 DIAGNOSIS — M5416 Radiculopathy, lumbar region: Secondary | ICD-10-CM | POA: Diagnosis not present

## 2021-02-28 DIAGNOSIS — R6889 Other general symptoms and signs: Secondary | ICD-10-CM | POA: Diagnosis not present

## 2021-02-28 DIAGNOSIS — M545 Low back pain, unspecified: Secondary | ICD-10-CM | POA: Diagnosis not present

## 2021-03-01 DIAGNOSIS — M5416 Radiculopathy, lumbar region: Secondary | ICD-10-CM | POA: Diagnosis not present

## 2021-03-06 ENCOUNTER — Encounter: Payer: Self-pay | Admitting: Family Medicine

## 2021-03-06 ENCOUNTER — Other Ambulatory Visit: Payer: Self-pay

## 2021-03-06 ENCOUNTER — Ambulatory Visit: Payer: BC Managed Care – PPO | Admitting: Family Medicine

## 2021-03-06 VITALS — BP 112/75 | HR 73 | Temp 98.2°F | Ht 70.0 in | Wt 223.0 lb

## 2021-03-06 DIAGNOSIS — E119 Type 2 diabetes mellitus without complications: Secondary | ICD-10-CM

## 2021-03-06 DIAGNOSIS — L988 Other specified disorders of the skin and subcutaneous tissue: Secondary | ICD-10-CM

## 2021-03-06 LAB — POCT GLYCOSYLATED HEMOGLOBIN (HGB A1C): Hemoglobin A1C: 7.1 % — AB (ref 4.0–5.6)

## 2021-03-06 NOTE — Assessment & Plan Note (Signed)
We discussed care of macerated skin.  Recommend application of rubbing alcohol after shower and drying thoroughly.  Also recommend that he use absorbent foot powder such as Zeasorb daily.  We will plan to follow-up in about 2 weeks to recheck.  He is aware to schedule a an appointment sooner if symptoms are worsening.

## 2021-03-06 NOTE — Progress Notes (Signed)
William Ortega - 61 y.o. male MRN [id number]  Date of birth: 03/02/1961  Subjective Chief Complaint  Patient presents with   Sore    HPI William Ortega is a 60 year old male with history of diabetes here today with complaint of sore between toes.  This is on the left foot.  There is a white patch between the home third and fourth digit.  He did have some pain with this initially but this seems to have improved.  He tried applying some alcohol to this area which seemed to help some.  He has not had bleeding or drainage from this area.  He does have good sensation in his feet.  ROS:  A comprehensive ROS was completed and negative except as noted per HPI  Allergies  Allergen Reactions   Penicillins Rash    Past Medical History:  Diagnosis Date   Allergy    Atrial fibrillation (HCC)    Bulging lumbar disc    OSA (obstructive sleep apnea)     Past Surgical History:  Procedure Laterality Date   KNEE ARTHROPLASTY     NASAL SEPTUM SURGERY     VASECTOMY      Social History   Socioeconomic History   Marital status: Married    Spouse name: Not on file   Number of children: Not on file   Years of education: Not on file   Highest education level: Not on file  Occupational History   Not on file  Tobacco Use   Smoking status: Never   Smokeless tobacco: Never  Substance and Sexual Activity   Alcohol use: No   Drug use: No   Sexual activity: Yes  Other Topics Concern   Not on file  Social History Narrative   Not on file   Social Determinants of Health   Financial Resource Strain: Not on file  Food Insecurity: Not on file  Transportation Needs: Not on file  Physical Activity: Not on file  Stress: Not on file  Social Connections: Not on file    Family History  Problem Relation Age of Onset   Heart disease Mother    Stroke Paternal Grandmother     Health Maintenance  Topic Date Due   COVID-19 Vaccine (3 - Pfizer risk series) 10/06/2019   OPHTHALMOLOGY EXAM  01/08/2020    INFLUENZA VACCINE  01/07/2021   FOOT EXAM  08/17/2021   URINE MICROALBUMIN  08/17/2021   HEMOGLOBIN A1C  09/03/2021   Fecal DNA (Cologuard)  08/12/2022   TETANUS/TDAP  03/07/2026   Hepatitis C Screening  Completed   HIV Screening  Completed   Zoster Vaccines- Shingrix  Completed   HPV VACCINES  Aged Out     ----------------------------------------------------------------------------------------------------------------------------------------------------------------------------------------------------------------- Physical Exam BP 112/75 (BP Location: Left Arm, Patient Position: Sitting, Cuff Size: Large)   Pulse 73   Temp 98.2 F (36.8 C)   Ht 5\' 10"  (1.778 m)   Wt 223 lb (101.2 kg)   SpO2 98%   BMI 32.00 kg/m   Physical Exam Constitutional:      Appearance: Normal appearance.  Cardiovascular:     Pulses: Normal pulses.  Skin:    Comments: Interdigital maceration noted between third and fourth digit of left foot.  There is no ulceration noted.  There is no bleeding or drainage noted.  Neurological:     Mental Status: He is alert.    ------------------------------------------------------------------------------------------------------------------------------------------------------------------------------------------------------------------- Assessment and Plan  Maceration of skin We discussed care of macerated skin.  Recommend application of rubbing alcohol after  shower and drying thoroughly.  Also recommend that he use absorbent foot powder such as Zeasorb daily.  We will plan to follow-up in about 2 weeks to recheck.  He is aware to schedule a an appointment sooner if symptoms are worsening.  Controlled type 2 diabetes mellitus without complication, without long-term current use of insulin (HCC) Worsening control of diabetes since last visit.  Recommend that he work on dietary and lifestyle change.  We discussed adding Rybelsus however he would like to hold off on this  for now   No orders of the defined types were placed in this encounter.   Return in about 2 weeks (around 03/20/2021) for Foot rash.    This visit occurred during the SARS-CoV-2 public health emergency.  Safety protocols were in place, including screening questions prior to the visit, additional usage of staff PPE, and extensive cleaning of exam room while observing appropriate contact time as indicated for disinfecting solutions.

## 2021-03-06 NOTE — Patient Instructions (Signed)
Nice to meet you today! Apply a small amount of rubbing alcohol or tea tree oil between the toes after showering and dry skin thoroughly.  Use Zeabsorb in the shoes daily.  Follow up in 2-3 weeks or sooner if worsening.

## 2021-03-06 NOTE — Assessment & Plan Note (Signed)
Worsening control of diabetes since last visit.  Recommend that he work on dietary and lifestyle change.  We discussed adding Rybelsus however he would like to hold off on this for now

## 2021-03-08 DIAGNOSIS — G4733 Obstructive sleep apnea (adult) (pediatric): Secondary | ICD-10-CM | POA: Diagnosis not present

## 2021-03-14 DIAGNOSIS — R6889 Other general symptoms and signs: Secondary | ICD-10-CM | POA: Diagnosis not present

## 2021-03-14 DIAGNOSIS — M25511 Pain in right shoulder: Secondary | ICD-10-CM | POA: Diagnosis not present

## 2021-04-03 DIAGNOSIS — R6889 Other general symptoms and signs: Secondary | ICD-10-CM | POA: Diagnosis not present

## 2021-04-03 DIAGNOSIS — M545 Low back pain, unspecified: Secondary | ICD-10-CM | POA: Diagnosis not present

## 2021-04-07 DIAGNOSIS — G4733 Obstructive sleep apnea (adult) (pediatric): Secondary | ICD-10-CM | POA: Diagnosis not present

## 2021-04-16 ENCOUNTER — Other Ambulatory Visit: Payer: Self-pay | Admitting: Sports Medicine

## 2021-05-01 DIAGNOSIS — G4733 Obstructive sleep apnea (adult) (pediatric): Secondary | ICD-10-CM | POA: Diagnosis not present

## 2021-05-07 DIAGNOSIS — G4733 Obstructive sleep apnea (adult) (pediatric): Secondary | ICD-10-CM | POA: Diagnosis not present

## 2021-05-08 DIAGNOSIS — G4733 Obstructive sleep apnea (adult) (pediatric): Secondary | ICD-10-CM | POA: Diagnosis not present

## 2021-05-09 DIAGNOSIS — L600 Ingrowing nail: Secondary | ICD-10-CM | POA: Diagnosis not present

## 2021-05-09 DIAGNOSIS — L603 Nail dystrophy: Secondary | ICD-10-CM | POA: Diagnosis not present

## 2021-05-09 DIAGNOSIS — M79674 Pain in right toe(s): Secondary | ICD-10-CM | POA: Diagnosis not present

## 2021-05-23 DIAGNOSIS — L600 Ingrowing nail: Secondary | ICD-10-CM | POA: Diagnosis not present

## 2021-05-31 DIAGNOSIS — G4733 Obstructive sleep apnea (adult) (pediatric): Secondary | ICD-10-CM | POA: Diagnosis not present

## 2021-06-06 DIAGNOSIS — G4733 Obstructive sleep apnea (adult) (pediatric): Secondary | ICD-10-CM | POA: Diagnosis not present

## 2021-06-12 ENCOUNTER — Other Ambulatory Visit: Payer: Self-pay | Admitting: Sports Medicine

## 2021-06-12 DIAGNOSIS — E119 Type 2 diabetes mellitus without complications: Secondary | ICD-10-CM

## 2021-06-12 DIAGNOSIS — E7800 Pure hypercholesterolemia, unspecified: Secondary | ICD-10-CM

## 2021-06-12 DIAGNOSIS — E291 Testicular hypofunction: Secondary | ICD-10-CM

## 2021-06-15 ENCOUNTER — Telehealth: Payer: Self-pay

## 2021-06-15 NOTE — Telephone Encounter (Signed)
Medication: testosterone cypionate (DEPOTESTOSTERONE CYPIONATE) 200 MG/ML injection Prior authorization submitted via CoverMyMeds on 06/15/2021 PA submission pending

## 2021-07-01 DIAGNOSIS — G4733 Obstructive sleep apnea (adult) (pediatric): Secondary | ICD-10-CM | POA: Diagnosis not present

## 2021-07-01 DIAGNOSIS — Z9989 Dependence on other enabling machines and devices: Secondary | ICD-10-CM | POA: Diagnosis not present

## 2021-07-07 DIAGNOSIS — G4733 Obstructive sleep apnea (adult) (pediatric): Secondary | ICD-10-CM | POA: Diagnosis not present

## 2021-07-29 DIAGNOSIS — J45909 Unspecified asthma, uncomplicated: Secondary | ICD-10-CM | POA: Diagnosis not present

## 2021-07-29 DIAGNOSIS — I480 Paroxysmal atrial fibrillation: Secondary | ICD-10-CM | POA: Diagnosis not present

## 2021-07-29 DIAGNOSIS — J209 Acute bronchitis, unspecified: Secondary | ICD-10-CM | POA: Diagnosis not present

## 2021-07-29 DIAGNOSIS — J019 Acute sinusitis, unspecified: Secondary | ICD-10-CM | POA: Diagnosis not present

## 2021-07-30 DIAGNOSIS — I498 Other specified cardiac arrhythmias: Secondary | ICD-10-CM | POA: Diagnosis not present

## 2021-07-30 DIAGNOSIS — I480 Paroxysmal atrial fibrillation: Secondary | ICD-10-CM | POA: Diagnosis not present

## 2021-08-01 DIAGNOSIS — G4733 Obstructive sleep apnea (adult) (pediatric): Secondary | ICD-10-CM | POA: Diagnosis not present

## 2021-08-03 ENCOUNTER — Other Ambulatory Visit: Payer: Self-pay | Admitting: Sports Medicine

## 2021-08-06 DIAGNOSIS — G4733 Obstructive sleep apnea (adult) (pediatric): Secondary | ICD-10-CM | POA: Diagnosis not present

## 2021-08-29 DIAGNOSIS — G4733 Obstructive sleep apnea (adult) (pediatric): Secondary | ICD-10-CM | POA: Diagnosis not present

## 2021-09-03 DIAGNOSIS — G4733 Obstructive sleep apnea (adult) (pediatric): Secondary | ICD-10-CM | POA: Diagnosis not present

## 2021-09-19 DIAGNOSIS — M5416 Radiculopathy, lumbar region: Secondary | ICD-10-CM | POA: Diagnosis not present

## 2021-09-19 DIAGNOSIS — M5459 Other low back pain: Secondary | ICD-10-CM | POA: Diagnosis not present

## 2021-09-29 DIAGNOSIS — G4733 Obstructive sleep apnea (adult) (pediatric): Secondary | ICD-10-CM | POA: Diagnosis not present

## 2021-10-01 ENCOUNTER — Other Ambulatory Visit: Payer: Self-pay | Admitting: Sports Medicine

## 2021-10-01 DIAGNOSIS — G6289 Other specified polyneuropathies: Secondary | ICD-10-CM

## 2021-10-01 DIAGNOSIS — M5416 Radiculopathy, lumbar region: Secondary | ICD-10-CM | POA: Diagnosis not present

## 2021-10-04 DIAGNOSIS — G4733 Obstructive sleep apnea (adult) (pediatric): Secondary | ICD-10-CM | POA: Diagnosis not present

## 2021-10-29 DIAGNOSIS — G4733 Obstructive sleep apnea (adult) (pediatric): Secondary | ICD-10-CM | POA: Diagnosis not present

## 2021-10-31 ENCOUNTER — Other Ambulatory Visit: Payer: Self-pay | Admitting: Sports Medicine

## 2021-11-01 ENCOUNTER — Other Ambulatory Visit: Payer: Self-pay | Admitting: Sports Medicine

## 2021-11-01 ENCOUNTER — Encounter: Payer: BC Managed Care – PPO | Admitting: Sports Medicine

## 2021-11-05 ENCOUNTER — Ambulatory Visit (INDEPENDENT_AMBULATORY_CARE_PROVIDER_SITE_OTHER): Payer: BC Managed Care – PPO | Admitting: Sports Medicine

## 2021-11-05 ENCOUNTER — Encounter: Payer: Self-pay | Admitting: Sports Medicine

## 2021-11-05 VITALS — BP 110/69 | HR 75 | Ht 70.0 in | Wt 225.0 lb

## 2021-11-05 DIAGNOSIS — E119 Type 2 diabetes mellitus without complications: Secondary | ICD-10-CM

## 2021-11-05 DIAGNOSIS — I10 Essential (primary) hypertension: Secondary | ICD-10-CM | POA: Diagnosis not present

## 2021-11-05 DIAGNOSIS — Z0000 Encounter for general adult medical examination without abnormal findings: Secondary | ICD-10-CM | POA: Diagnosis not present

## 2021-11-05 DIAGNOSIS — E291 Testicular hypofunction: Secondary | ICD-10-CM

## 2021-11-05 DIAGNOSIS — M1A0690 Idiopathic chronic gout, unspecified knee, without tophus (tophi): Secondary | ICD-10-CM | POA: Diagnosis not present

## 2021-11-05 DIAGNOSIS — K429 Umbilical hernia without obstruction or gangrene: Secondary | ICD-10-CM

## 2021-11-05 DIAGNOSIS — R7989 Other specified abnormal findings of blood chemistry: Secondary | ICD-10-CM

## 2021-11-05 DIAGNOSIS — G4733 Obstructive sleep apnea (adult) (pediatric): Secondary | ICD-10-CM | POA: Diagnosis not present

## 2021-11-05 MED ORDER — MOUNJARO 2.5 MG/0.5ML ~~LOC~~ SOAJ: 2.5000 mg | SUBCUTANEOUS | 0 refills | Status: DC

## 2021-11-05 NOTE — Assessment & Plan Note (Signed)
Annual physical as above.  

## 2021-11-05 NOTE — Progress Notes (Addendum)
Subjective:    CC: Annual Physical Exam  HPI:  This patient is here for their annual physical  I reviewed the past medical history, family history, social history, surgical history, and allergies today and no changes were needed.  Please see the problem list section below in epic for further details.  Past Medical History: Past Medical History:  Diagnosis Date   Allergy    Atrial fibrillation (HCC)    Bulging lumbar disc    OSA (obstructive sleep apnea)    Past Surgical History: Past Surgical History:  Procedure Laterality Date   KNEE ARTHROPLASTY     NASAL SEPTUM SURGERY     VASECTOMY     Social History: Social History   Socioeconomic History   Marital status: Married    Spouse name: Not on file   Number of children: Not on file   Years of education: Not on file   Highest education level: Not on file  Occupational History   Not on file  Tobacco Use   Smoking status: Never   Smokeless tobacco: Never  Substance and Sexual Activity   Alcohol use: No   Drug use: No   Sexual activity: Yes  Other Topics Concern   Not on file  Social History Narrative   Not on file   Social Determinants of Health   Financial Resource Strain: Not on file  Food Insecurity: Not on file  Transportation Needs: Not on file  Physical Activity: Not on file  Stress: Not on file  Social Connections: Not on file   Family History: Family History  Problem Relation Age of Onset   Heart disease Mother    Stroke Paternal Grandmother    Allergies: Allergies  Allergen Reactions   Penicillins Rash   Medications: See med rec.  Review of Systems: No headache, visual changes, nausea, vomiting, diarrhea, constipation, dizziness, abdominal pain, skin rash, fevers, chills, night sweats, swollen lymph nodes, weight loss, chest pain, body aches, joint swelling, muscle aches, shortness of breath, mood changes, visual or auditory hallucinations.  Objective:    General: Well Developed, well  nourished, and in no acute distress.  Neuro: Alert and oriented x3, extra-ocular muscles intact, sensation grossly intact. Cranial nerves II through XII are intact, motor, sensory, and coordinative functions are all intact. HEENT: Normocephalic, atraumatic, pupils equal round reactive to light, neck supple, no masses, no lymphadenopathy, thyroid nonpalpable. Oropharynx, nasopharynx, external ear canals are unremarkable. Skin: Warm and dry, no rashes noted.  Cardiac: Regular rate and rhythm, no murmurs rubs or gallops.  Respiratory: Clear to auscultation bilaterally. Not using accessory muscles, speaking in full sentences.  Abdominal: Soft, nontender, nondistended, positive bowel sounds, no masses, no organomegaly.  Musculoskeletal: Shoulder, elbow, wrist, hip, knee, ankle stable, and with full range of motion.  Impression and Recommendations:    The patient was counselled, risk factors were discussed, anticipatory guidance given.  Annual physical exam Annual physical as above.   Benign essential hypertension Well-controlled, no changes.  Controlled type 2 diabetes mellitus without complication, without long-term current use of insulin (HCC) Checking routine labs. Did have an epidural recently so we may see elevated fasting blood sugar and WBC count. Continue current medications, adding Mounjaro, he does prefer once a week GLP-1 to an oral. We may need to try couple before we get approval.  Umbilical hernia Historical umbilical hernia with diastasis recti. Referral again to general surgery.  ___________________________________________ Ihor Austin. Benjamin Stain, M.D., ABFM., CAQSM. Primary Care and Sports Medicine [facility] [place]  Adjunct Professor of [place] of [employer] [facility] of Medicine

## 2021-11-05 NOTE — Assessment & Plan Note (Signed)
Well controlled, no changes 

## 2021-11-05 NOTE — Assessment & Plan Note (Signed)
Checking routine labs. Did have an epidural recently so we may see elevated fasting blood sugar and WBC count. Continue current medications, adding Mounjaro, he does prefer once a week GLP-1 to an oral. We may need to try couple before we get approval.

## 2021-11-05 NOTE — Assessment & Plan Note (Signed)
Historical umbilical hernia with diastasis recti. Referral again to general surgery.

## 2021-11-06 ENCOUNTER — Encounter: Payer: Self-pay | Admitting: Sports Medicine

## 2021-11-06 DIAGNOSIS — R7989 Other specified abnormal findings of blood chemistry: Secondary | ICD-10-CM | POA: Insufficient documentation

## 2021-11-06 NOTE — Addendum Note (Signed)
Addended by: Monica Becton on: 11/06/2021 08:26 AM   Modules accepted: Orders

## 2021-11-06 NOTE — Assessment & Plan Note (Signed)
Mildly elevated, recheck TSH, T3, T4 in 6 weeks.

## 2021-11-09 LAB — CBC
HCT: 48.4 % (ref 38.5–50.0)
Hemoglobin: 16.4 g/dL (ref 13.2–17.1)
MCH: 32.5 pg (ref 27.0–33.0)
MCHC: 33.9 g/dL (ref 32.0–36.0)
MCV: 95.8 fL (ref 80.0–100.0)
MPV: 10.4 fL (ref 7.5–12.5)
Platelets: 137 10*3/uL — ABNORMAL LOW (ref 140–400)
RBC: 5.05 10*6/uL (ref 4.20–5.80)
RDW: 13.1 % (ref 11.0–15.0)
WBC: 5.1 10*3/uL (ref 3.8–10.8)

## 2021-11-09 LAB — MICROALBUMIN / CREATININE URINE RATIO
Creatinine, Urine: 119 mg/dL (ref 20–320)
Microalb Creat Ratio: 5 mcg/mg creat (ref <30)
Microalb, Ur: 0.6 mg/dL

## 2021-11-09 LAB — LIPID PANEL
Cholesterol: 115 mg/dL (ref <200)
HDL: 31 mg/dL — ABNORMAL LOW (ref >=40)
LDL Cholesterol (Calc): 56 mg/dL (calc)
Non-HDL Cholesterol (Calc): 84 mg/dL (calc) (ref <130)
Total CHOL/HDL Ratio: 3.7 (calc) (ref <5.0)
Triglycerides: 201 mg/dL — ABNORMAL HIGH (ref <150)

## 2021-11-09 LAB — COMPLETE METABOLIC PANEL WITH GFR
AG Ratio: 2.3 (calc) (ref 1.0–2.5)
ALT: 60 U/L — ABNORMAL HIGH (ref 9–46)
AST: 36 U/L — ABNORMAL HIGH (ref 10–35)
Albumin: 4.5 g/dL (ref 3.6–5.1)
Alkaline phosphatase (APISO): 53 U/L (ref 35–144)
BUN: 20 mg/dL (ref 7–25)
CO2: 28 mmol/L (ref 20–32)
Calcium: 9.1 mg/dL (ref 8.6–10.3)
Chloride: 104 mmol/L (ref 98–110)
Creat: 1.14 mg/dL (ref 0.70–1.30)
Globulin: 2 g/dL (calc) (ref 1.9–3.7)
Glucose, Bld: 176 mg/dL — ABNORMAL HIGH (ref 65–99)
Potassium: 4.8 mmol/L (ref 3.5–5.3)
Sodium: 141 mmol/L (ref 135–146)
Total Bilirubin: 1.5 mg/dL — ABNORMAL HIGH (ref 0.2–1.2)
Total Protein: 6.5 g/dL (ref 6.1–8.1)
eGFR: 75 mL/min/{1.73_m2} (ref >=60)

## 2021-11-09 LAB — TSH: TSH: 4.8 mIU/L — ABNORMAL HIGH (ref 0.40–4.50)

## 2021-11-09 LAB — TESTOSTERONE, FREE & TOTAL
Free Testosterone: 97 pg/mL (ref 35.0–155.0)
Testosterone, Total, LC-MS-MS: 345 ng/dL (ref 250–1100)

## 2021-11-09 LAB — HEMOGLOBIN A1C
Hgb A1c MFr Bld: 7.3 % of total Hgb — ABNORMAL HIGH (ref <5.7)
Mean Plasma Glucose: 163 mg/dL
eAG (mmol/L): 9 mmol/L

## 2021-11-09 LAB — PSA, TOTAL AND FREE
PSA, % Free: 31 % (calc) (ref >25)
PSA, Free: 0.8 ng/mL
PSA, Total: 2.6 ng/mL (ref <=4.0)

## 2021-11-09 LAB — URIC ACID: Uric Acid, Serum: 3 mg/dL — ABNORMAL LOW (ref 4.0–8.0)

## 2021-11-26 ENCOUNTER — Other Ambulatory Visit: Payer: Self-pay | Admitting: Sports Medicine

## 2021-11-28 ENCOUNTER — Other Ambulatory Visit: Payer: Self-pay | Admitting: Sports Medicine

## 2021-11-28 DIAGNOSIS — E119 Type 2 diabetes mellitus without complications: Secondary | ICD-10-CM | POA: Diagnosis not present

## 2021-11-28 DIAGNOSIS — I480 Paroxysmal atrial fibrillation: Secondary | ICD-10-CM | POA: Diagnosis not present

## 2021-11-28 DIAGNOSIS — K429 Umbilical hernia without obstruction or gangrene: Secondary | ICD-10-CM | POA: Diagnosis not present

## 2021-11-28 DIAGNOSIS — G4733 Obstructive sleep apnea (adult) (pediatric): Secondary | ICD-10-CM | POA: Diagnosis not present

## 2021-11-28 DIAGNOSIS — E291 Testicular hypofunction: Secondary | ICD-10-CM

## 2021-11-29 DIAGNOSIS — R0609 Other forms of dyspnea: Secondary | ICD-10-CM | POA: Diagnosis not present

## 2021-11-29 DIAGNOSIS — I480 Paroxysmal atrial fibrillation: Secondary | ICD-10-CM | POA: Diagnosis not present

## 2021-11-29 DIAGNOSIS — G4733 Obstructive sleep apnea (adult) (pediatric): Secondary | ICD-10-CM | POA: Diagnosis not present

## 2021-11-29 DIAGNOSIS — Z01810 Encounter for preprocedural cardiovascular examination: Secondary | ICD-10-CM | POA: Diagnosis not present

## 2021-11-29 DIAGNOSIS — R079 Chest pain, unspecified: Secondary | ICD-10-CM | POA: Diagnosis not present

## 2021-12-04 ENCOUNTER — Encounter: Payer: Self-pay | Admitting: Sports Medicine

## 2021-12-04 ENCOUNTER — Ambulatory Visit: Payer: BC Managed Care – PPO | Admitting: Sports Medicine

## 2021-12-04 DIAGNOSIS — E119 Type 2 diabetes mellitus without complications: Secondary | ICD-10-CM

## 2021-12-04 DIAGNOSIS — R7989 Other specified abnormal findings of blood chemistry: Secondary | ICD-10-CM | POA: Diagnosis not present

## 2021-12-04 MED ORDER — MOUNJARO 12.5 MG/0.5ML ~~LOC~~ SOAJ: 12.5000 mg | SUBCUTANEOUS | 0 refills | Status: DC

## 2021-12-04 MED ORDER — MOUNJARO 10 MG/0.5ML ~~LOC~~ SOAJ: 10.0000 mg | SUBCUTANEOUS | 0 refills | Status: DC

## 2021-12-04 MED ORDER — MOUNJARO 5 MG/0.5ML ~~LOC~~ SOAJ: 5.0000 mg | SUBCUTANEOUS | 0 refills | Status: DC

## 2021-12-04 MED ORDER — MOUNJARO 7.5 MG/0.5ML ~~LOC~~ SOAJ: 7.5000 mg | SUBCUTANEOUS | 0 refills | Status: DC

## 2021-12-04 NOTE — Assessment & Plan Note (Signed)
Pleasant 61 year old male, history of diabetes, recently started Mounjaro, 10 pound weight loss after the first month, we will hold off on rechecking his A1c for a few months, we are going to go ahead and also prescribe the next 4 doses of Mounjaro, I would like him to titrate slowly/monthly up to the maximum dose to maximize his weight loss. Continue Xigduo for now although we may discontinue this once he gets to the maximum dose Mounjaro.   No side effects. He does have a cardiac stress test coming up, once this is cleared we can restart with his exercise prescription.

## 2021-12-04 NOTE — Progress Notes (Signed)
    Procedures performed today:    None.  Independent interpretation of notes and tests performed by another provider:   None.  Brief History, Exam, Impression, and Recommendations:    Controlled type 2 diabetes mellitus without complication, without long-term current use of insulin (HCC) Pleasant 61 year old male, history of diabetes, recently started Mounjaro, 10 pound weight loss after the first month, we will hold off on rechecking his A1c for a few months, we are going to go ahead and also prescribe the next 4 doses of Mounjaro, I would like him to titrate slowly/monthly up to the maximum dose to maximize his weight loss. Continue Xigduo for now although we may discontinue this once he gets to the maximum dose Mounjaro.   No side effects. He does have a cardiac stress test coming up, once this is cleared we can restart with his exercise prescription.  Elevated TSH TSH elevated on the most recent labs, has been a month, we will recheck TSH, T3, T4 today.  Chronic process not at goal with pharmacologic intervention  ____________________________________________ Ihor Austin. Benjamin Stain, M.D., ABFM., CAQSM., AME. Primary Care and Sports Medicine [facility]  Adjunct Professor of Family Medicine  [place] [employer] [facility] [facility] [employer] Medicine  Restaurant manager, fast food

## 2021-12-04 NOTE — Assessment & Plan Note (Signed)
TSH elevated on the most recent labs, has been a month, we will recheck TSH, T3, T4 today.

## 2021-12-05 LAB — T3, FREE: T3, Free: 2.5 pg/mL (ref 2.3–4.2)

## 2021-12-05 LAB — TSH: TSH: 3.03 mIU/L (ref 0.40–4.50)

## 2021-12-05 LAB — T4, FREE: Free T4: 1.1 ng/dL (ref 0.8–1.8)

## 2021-12-06 DIAGNOSIS — G4733 Obstructive sleep apnea (adult) (pediatric): Secondary | ICD-10-CM | POA: Diagnosis not present

## 2021-12-29 DIAGNOSIS — G4733 Obstructive sleep apnea (adult) (pediatric): Secondary | ICD-10-CM | POA: Diagnosis not present

## 2021-12-31 ENCOUNTER — Other Ambulatory Visit: Payer: Self-pay | Admitting: Sports Medicine

## 2021-12-31 DIAGNOSIS — E119 Type 2 diabetes mellitus without complications: Secondary | ICD-10-CM

## 2021-12-31 DIAGNOSIS — G6289 Other specified polyneuropathies: Secondary | ICD-10-CM

## 2022-01-03 ENCOUNTER — Telehealth: Payer: Self-pay | Admitting: Sports Medicine

## 2022-01-03 NOTE — Telephone Encounter (Signed)
Pt called. Pharmacy does not have the Mounjaro 7.5, its on back order. He wants to know if he should go back to the 5 or skip a week? He is supposed to take the med on Sunday.

## 2022-01-03 NOTE — Telephone Encounter (Signed)
We could just skip a month and go to the next higher dose if okay with him, he can go ahead and ask them to just pick up the 10 mg.

## 2022-01-05 DIAGNOSIS — G4733 Obstructive sleep apnea (adult) (pediatric): Secondary | ICD-10-CM | POA: Diagnosis not present

## 2022-01-06 ENCOUNTER — Other Ambulatory Visit: Payer: Self-pay | Admitting: Nurse Practitioner

## 2022-01-09 DIAGNOSIS — M4316 Spondylolisthesis, lumbar region: Secondary | ICD-10-CM | POA: Diagnosis not present

## 2022-01-09 DIAGNOSIS — M5416 Radiculopathy, lumbar region: Secondary | ICD-10-CM | POA: Diagnosis not present

## 2022-01-09 DIAGNOSIS — M5126 Other intervertebral disc displacement, lumbar region: Secondary | ICD-10-CM | POA: Diagnosis not present

## 2022-01-10 DIAGNOSIS — I517 Cardiomegaly: Secondary | ICD-10-CM | POA: Diagnosis not present

## 2022-01-10 DIAGNOSIS — R079 Chest pain, unspecified: Secondary | ICD-10-CM | POA: Diagnosis not present

## 2022-01-16 ENCOUNTER — Encounter: Payer: Self-pay | Admitting: Sports Medicine

## 2022-01-16 DIAGNOSIS — E291 Testicular hypofunction: Secondary | ICD-10-CM

## 2022-01-17 MED ORDER — "BD LUER-LOK SYRINGE 18G X 1-1/2"" 3 ML MISC": 99 refills | Status: DC

## 2022-01-17 MED ORDER — "BD DISP NEEDLE 23G X 1"" MISC": 8 refills | Status: DC

## 2022-01-22 DIAGNOSIS — M4316 Spondylolisthesis, lumbar region: Secondary | ICD-10-CM | POA: Diagnosis not present

## 2022-01-22 DIAGNOSIS — M5416 Radiculopathy, lumbar region: Secondary | ICD-10-CM | POA: Diagnosis not present

## 2022-01-28 ENCOUNTER — Encounter: Payer: Self-pay | Admitting: Sports Medicine

## 2022-01-31 ENCOUNTER — Other Ambulatory Visit: Payer: Self-pay | Admitting: Sports Medicine

## 2022-01-31 DIAGNOSIS — E119 Type 2 diabetes mellitus without complications: Secondary | ICD-10-CM

## 2022-02-03 DIAGNOSIS — G4733 Obstructive sleep apnea (adult) (pediatric): Secondary | ICD-10-CM | POA: Diagnosis not present

## 2022-02-05 DIAGNOSIS — G4733 Obstructive sleep apnea (adult) (pediatric): Secondary | ICD-10-CM | POA: Diagnosis not present

## 2022-03-06 DIAGNOSIS — G4733 Obstructive sleep apnea (adult) (pediatric): Secondary | ICD-10-CM | POA: Diagnosis not present

## 2022-03-14 ENCOUNTER — Ambulatory Visit: Payer: Self-pay | Admitting: General Surgery

## 2022-03-14 NOTE — Progress Notes (Signed)
Sent message, via epic in basket, requesting orders in epic from surgeon.  

## 2022-03-17 NOTE — Progress Notes (Signed)
COVID Vaccine Completed: yes  Date of COVID positive in last 90 days:  PCP - Aundria Mems, MD Cardiologist -   Chest x-ray -  EKG - 11/29/21 req Stress Test - 01/10/22 CE ECHO - 01/10/22 CE Cardiac Cath -  Pacemaker/ICD device last checked: Spinal Cord Stimulator:  Bowel Prep -   Sleep Study -  CPAP -   Fasting Blood Sugar -  Checks Blood Sugar _____ times a day  Blood Thinner Instructions: Aspirin Instructions: ASA 81 Last Dose:  Activity level:  Can go up a flight of stairs and perform activities of daily living without stopping and without symptoms of chest pain or shortness of breath.  Able to exercise without symptoms  Unable to go up a flight of stairs without symptoms of     Anesthesia review: a fib, HTN, OSA, DM2  Patient denies shortness of breath, fever, cough and chest pain at PAT appointment  Patient verbalized understanding of instructions that were given to them at the PAT appointment. Patient was also instructed that they will need to review over the PAT instructions again at home before surgery.

## 2022-03-17 NOTE — Patient Instructions (Signed)
SURGICAL WAITING ROOM VISITATION Patients having surgery or a procedure may have no more than 2 support people in the waiting area - these visitors may rotate.   Children under the age of 91 must have an adult with them who is not the patient. If the patient needs to stay at the hospital during part of their recovery, the visitor guidelines for inpatient rooms apply. Pre-op nurse will coordinate an appropriate time for 1 support person to accompany patient in pre-op.  This support person may not rotate.    Please refer to the [facility] website for the visitor guidelines for Inpatients (after your surgery is over and you are in a regular room).    Your procedure is scheduled on: 03/20/22   Report to [facility] Main Entrance    Report to admitting at 11:30 AM   Call this number if you have problems the morning of surgery [phone]   Do not eat food :After Midnight.   After Midnight you may have the following liquids until 10:45 AM DAY OF SURGERY  Water Non-Citrus Juices (without pulp, NO RED) Carbonated Beverages Black Coffee (NO MILK/CREAM OR CREAMERS, sugar ok)  Clear Tea (NO MILK/CREAM OR CREAMERS, sugar ok) regular and decaf                             Plain Jell-O (NO RED)                                           Fruit ices (not with fruit pulp, NO RED)                                     Popsicles (NO RED)                                                               Sports drinks like Gatorade (NO RED)                 The day of surgery:  Drink ONE (1) Pre-Surgery G2 at 10:45 AM the morning of surgery. Drink in one sitting. Do not sip.  This drink was given to you during your hospital  pre-op appointment visit. Nothing else to drink after completing the  Pre-Surgery G2.          If you have questions, please contact your surgeon's office.   FOLLOW BOWEL PREP AND ANY ADDITIONAL PRE OP INSTRUCTIONS YOU RECEIVED FROM YOUR SURGEON'S OFFICE!!!     Oral  Hygiene is also important to reduce your risk of infection.                                    Remember - BRUSH YOUR TEETH THE MORNING OF SURGERY WITH YOUR REGULAR TOOTHPASTE   Do NOT smoke after Midnight   Take these medicines the morning of surgery with A SIP OF WATER: Allopurinol, Tambocor, Gabapentin, Claritin, Metoprolol, Rosuvastatin   DO NOT TAKE ANY ORAL DIABETIC MEDICATIONS  DAY OF YOUR SURGERY  How to Manage Your Diabetes Before and After Surgery  Why is it important to control my blood sugar before and after surgery? Improving blood sugar levels before and after surgery helps healing and can limit problems. A way of improving blood sugar control is eating a healthy diet by:  Eating less sugar and carbohydrates  Increasing activity/exercise  Talking with your doctor about reaching your blood sugar goals High blood sugars (greater than 180 mg/dL) can raise your risk of infections and slow your recovery, so you will need to focus on controlling your diabetes during the weeks before surgery. Make sure that the doctor who takes care of your diabetes knows about your planned surgery including the date and location.  How do I manage my blood sugar before surgery? Check your blood sugar at least 4 times a day, starting 2 days before surgery, to make sure that the level is not too high or low. Check your blood sugar the morning of your surgery when you wake up and every 2 hours until you get to the Short Stay unit. If your blood sugar is less than 70 mg/dL, you will need to treat for low blood sugar: Do not take insulin. Treat a low blood sugar (less than 70 mg/dL) with  cup of clear juice (cranberry or apple), 4 glucose tablets, OR glucose gel. Recheck blood sugar in 15 minutes after treatment (to make sure it is greater than 70 mg/dL). If your blood sugar is not greater than 70 mg/dL on recheck, call [phone] for further instructions. Report your blood sugar to the short stay  nurse when you get to Short Stay.  If you are admitted to the hospital after surgery: Your blood sugar will be checked by the staff and you will probably be given insulin after surgery (instead of oral diabetes medicines) to make sure you have good blood sugar levels. The goal for blood sugar control after surgery is 80-180 mg/dL.   WHAT DO I DO ABOUT MY DIABETES MEDICATION?  Do not take oral diabetes medicines (pills) the morning of surgery.  Hold Xigduo 3 days before surgery.  DO NOT TAKE THE FOLLOWING 7 DAYS PRIOR TO SURGERY: Ozempic, Wegovy, Rybelsus (Semaglutide), Byetta (exenatide), Bydureon (exenatide ER), Victoza, Saxenda (liraglutide), or Trulicity (dulaglutide) Mounjaro (Tirzepatide) Adlyxin (Lixisenatide), Polyethylene Glycol Loxenatide.  If your CBG is greater than 220 mg/dL, you may take  of your sliding scale  (correction) dose of insulin.  Reviewed and Endorsed by [facility] Patient Education Committee, August 2015   Bring CPAP mask and tubing day of surgery.                              You may not have any metal on your body including jewelry, and body piercing             Do not wear lotions, powders, cologne, or deodorant              Men may shave face and neck.   Do not bring valuables to the hospital. [place].   Contacts, dentures or bridgework may not be worn into surgery.  DO NOT [place]. PHARMACY WILL DISPENSE MEDICATIONS LISTED ON YOUR MEDICATION LIST TO YOU DURING YOUR ADMISSION [place]!    Patients  discharged on the day of surgery will not be allowed to drive home.  Someone NEEDS to stay with you for the first 24 hours after anesthesia.   Special Instructions: Bring a copy of your healthcare power of attorney and living will documents the day of surgery if you haven't scanned them before.              Please read over the following fact sheets you were  given: IF [place] [phone] Schneiders   If you received a COVID test during your pre-op visit  it is requested that you wear a mask when out in public, stay away from anyone that may not be feeling well and notify your surgeon if you develop symptoms. If you test positive for Covid or have been in contact with anyone that has tested positive in the last 10 days please notify you surgeon.     [place] - Preparing for Surgery Before surgery, you can play an important role.  Because skin is not sterile, your skin needs to be as free of germs as possible.  You can reduce the number of germs on your skin by washing with CHG (chlorahexidine gluconate) soap before surgery.  CHG is an antiseptic cleaner which kills germs and bonds with the skin to continue killing germs even after washing. Please DO NOT use if you have an allergy to CHG or antibacterial soaps.  If your skin becomes reddened/irritated stop using the CHG and inform your nurse when you arrive at Short Stay. Do not shave (including legs and underarms) for at least 48 hours prior to the first CHG shower.  You may shave your face/neck.  Please follow these instructions carefully:  1.  Shower with CHG Soap the night before surgery and the  morning of surgery.  2.  If you choose to wash your hair, wash your hair first as usual with your normal  shampoo.  3.  After you shampoo, rinse your hair and body thoroughly to remove the shampoo.                             4.  Use CHG as you would any other liquid soap.  You can apply chg directly to the skin and wash.  Gently with a scrungie or clean washcloth.  5.  Apply the CHG Soap to your body ONLY FROM THE NECK DOWN.   Do   not use on face/ open                           Wound or open sores. Avoid contact with eyes, ears mouth and   genitals (private parts).                       Wash face,  Genitals (private parts) with your normal soap.              6.  Wash thoroughly, paying special attention to the area where your    surgery  will be performed.  7.  Thoroughly rinse your body with warm water from the neck down.  8.  DO NOT shower/wash with your normal soap after using and rinsing off the CHG Soap.                9.  Pat yourself dry with a clean towel.  10.  Wear clean pajamas.            11.  Place clean sheets on your bed the night of your first shower and do not  sleep with pets. Day of Surgery : Do not apply any lotions/deodorants the morning of surgery.  Please wear clean clothes to the hospital/surgery center.  FAILURE TO FOLLOW THESE INSTRUCTIONS MAY RESULT IN THE CANCELLATION OF YOUR SURGERY  PATIENT SIGNATURE_________________________________  NURSE SIGNATURE__________________________________  ________________________________________________________________________

## 2022-03-18 ENCOUNTER — Encounter (HOSPITAL_COMMUNITY): Payer: Self-pay

## 2022-03-18 ENCOUNTER — Encounter (HOSPITAL_COMMUNITY)
Admission: RE | Admit: 2022-03-18 | Discharge: 2022-03-18 | Disposition: A | Discharge status: Home / Self Care | Payer: BC Managed Care – PPO | Source: Ambulatory Visit | Attending: General Surgery | Admitting: General Surgery

## 2022-03-18 VITALS — BP 121/76 | HR 70 | Temp 98.1°F | Resp 12 | Ht 70.0 in | Wt 185.8 lb

## 2022-03-18 DIAGNOSIS — E119 Type 2 diabetes mellitus without complications: Secondary | ICD-10-CM | POA: Insufficient documentation

## 2022-03-18 DIAGNOSIS — Z01818 Encounter for other preprocedural examination: Secondary | ICD-10-CM | POA: Insufficient documentation

## 2022-03-18 DIAGNOSIS — K429 Umbilical hernia without obstruction or gangrene: Secondary | ICD-10-CM | POA: Diagnosis not present

## 2022-03-18 DIAGNOSIS — G4730 Sleep apnea, unspecified: Secondary | ICD-10-CM | POA: Diagnosis not present

## 2022-03-18 DIAGNOSIS — I4891 Unspecified atrial fibrillation: Secondary | ICD-10-CM | POA: Diagnosis not present

## 2022-03-18 DIAGNOSIS — Z7984 Long term (current) use of oral hypoglycemic drugs: Secondary | ICD-10-CM | POA: Diagnosis not present

## 2022-03-18 DIAGNOSIS — I10 Essential (primary) hypertension: Secondary | ICD-10-CM | POA: Diagnosis not present

## 2022-03-18 HISTORY — DX: Essential (primary) hypertension: I10

## 2022-03-18 HISTORY — DX: Type 2 diabetes mellitus without complications: E11.9

## 2022-03-18 HISTORY — DX: Pneumonia, unspecified organism: J18.9

## 2022-03-18 HISTORY — DX: Unspecified osteoarthritis, unspecified site: M19.90

## 2022-03-18 LAB — CBC
HCT: 48.7 % (ref 39.0–52.0)
Hemoglobin: 16.7 g/dL (ref 13.0–17.0)
MCH: 31.9 pg (ref 26.0–34.0)
MCHC: 34.3 g/dL (ref 30.0–36.0)
MCV: 93.1 fL (ref 80.0–100.0)
Platelets: 145 10*3/uL — ABNORMAL LOW (ref 150–400)
RBC: 5.23 MIL/uL (ref 4.22–5.81)
RDW: 13.3 % (ref 11.5–15.5)
WBC: 6.4 10*3/uL (ref 4.0–10.5)
nRBC: 0 % (ref 0.0–0.2)

## 2022-03-18 LAB — BASIC METABOLIC PANEL
Anion gap: 7 (ref 5–15)
BUN: 29 mg/dL — ABNORMAL HIGH (ref 6–20)
CO2: 26 mmol/L (ref 22–32)
Calcium: 9.1 mg/dL (ref 8.9–10.3)
Chloride: 107 mmol/L (ref 98–111)
Creatinine, Ser: 0.88 mg/dL (ref 0.61–1.24)
GFR, Estimated: >60 mL/min (ref >60)
Glucose, Bld: 83 mg/dL (ref 70–99)
Potassium: 4.4 mmol/L (ref 3.5–5.1)
Sodium: 140 mmol/L (ref 135–145)

## 2022-03-18 LAB — HEMOGLOBIN A1C
Hgb A1c MFr Bld: 4.4 % — ABNORMAL LOW (ref 4.8–5.6)
Mean Plasma Glucose: 79.58 mg/dL

## 2022-03-18 LAB — GLUCOSE, CAPILLARY: Glucose-Capillary: 96 mg/dL (ref 70–99)

## 2022-03-18 NOTE — Patient Instructions (Signed)
SURGICAL WAITING ROOM VISITATION Patients having surgery or a procedure may have no more than 2 support people in the waiting area - these visitors may rotate.   Children under the age of 91 must have an adult with them who is not the patient. If the patient needs to stay at the hospital during part of their recovery, the visitor guidelines for inpatient rooms apply. Pre-op nurse will coordinate an appropriate time for 1 support person to accompany patient in pre-op.  This support person may not rotate.    Please refer to the [facility] website for the visitor guidelines for Inpatients (after your surgery is over and you are in a regular room).    Your procedure is scheduled on: 03/20/22   Report to [facility] Main Entrance    Report to admitting at 11:30 AM   Call this number if you have problems the morning of surgery [phone]   Do not eat food :After Midnight.   After Midnight you may have the following liquids until 10:45 AM DAY OF SURGERY  Water Non-Citrus Juices (without pulp, NO RED) Carbonated Beverages Black Coffee (NO MILK/CREAM OR CREAMERS, sugar ok)  Clear Tea (NO MILK/CREAM OR CREAMERS, sugar ok) regular and decaf                             Plain Jell-O (NO RED)                                           Fruit ices (not with fruit pulp, NO RED)                                     Popsicles (NO RED)                                                               Sports drinks like Gatorade (NO RED)                 The day of surgery:  Drink ONE (1) Pre-Surgery G2 at 10:45 AM the morning of surgery. Drink in one sitting. Do not sip.  This drink was given to you during your hospital  pre-op appointment visit. Nothing else to drink after completing the  Pre-Surgery G2.          If you have questions, please contact your surgeon's office.   FOLLOW BOWEL PREP AND ANY ADDITIONAL PRE OP INSTRUCTIONS YOU RECEIVED FROM YOUR SURGEON'S OFFICE!!!     Oral  Hygiene is also important to reduce your risk of infection.                                    Remember - BRUSH YOUR TEETH THE MORNING OF SURGERY WITH YOUR REGULAR TOOTHPASTE   Do NOT smoke after Midnight   Take these medicines the morning of surgery with A SIP OF WATER: Allopurinol, Tambocor, Gabapentin, Claritin, Metoprolol, Rosuvastatin   DO NOT TAKE ANY ORAL DIABETIC MEDICATIONS  DAY OF YOUR SURGERY  How to Manage Your Diabetes Before and After Surgery  Why is it important to control my blood sugar before and after surgery? Improving blood sugar levels before and after surgery helps healing and can limit problems. A way of improving blood sugar control is eating a healthy diet by:  Eating less sugar and carbohydrates  Increasing activity/exercise  Talking with your doctor about reaching your blood sugar goals High blood sugars (greater than 180 mg/dL) can raise your risk of infections and slow your recovery, so you will need to focus on controlling your diabetes during the weeks before surgery. Make sure that the doctor who takes care of your diabetes knows about your planned surgery including the date and location.  How do I manage my blood sugar before surgery? Check your blood sugar at least 4 times a day, starting 2 days before surgery, to make sure that the level is not too high or low. Check your blood sugar the morning of your surgery when you wake up and every 2 hours until you get to the Short Stay unit. If your blood sugar is less than 70 mg/dL, you will need to treat for low blood sugar: Do not take insulin. Treat a low blood sugar (less than 70 mg/dL) with  cup of clear juice (cranberry or apple), 4 glucose tablets, OR glucose gel. Recheck blood sugar in 15 minutes after treatment (to make sure it is greater than 70 mg/dL). If your blood sugar is not greater than 70 mg/dL on recheck, call [phone] for further instructions. Report your blood sugar to the short stay  nurse when you get to Short Stay.  If you are admitted to the hospital after surgery: Your blood sugar will be checked by the staff and you will probably be given insulin after surgery (instead of oral diabetes medicines) to make sure you have good blood sugar levels. The goal for blood sugar control after surgery is 80-180 mg/dL.   WHAT DO I DO ABOUT MY DIABETES MEDICATION?  Do not take oral diabetes medicines (pills) the morning of surgery.  Hold Xigduo 3 days before surgery  DO NOT TAKE THE FOLLOWING 7 DAYS PRIOR TO SURGERY: Ozempic, Wegovy, Rybelsus (Semaglutide), Byetta (exenatide), Bydureon (exenatide ER), Victoza, Saxenda (liraglutide), or Trulicity (dulaglutide) Mounjaro (Tirzepatide) Adlyxin (Lixisenatide), Polyethylene Glycol Loxenatide.  Reviewed and Endorsed by [facility] Patient Education Committee, August 2015   Bring CPAP mask and tubing day of surgery.                              You may not have any metal on your body including jewelry, and body piercing             Do not wear lotions, powders, cologne, or deodorant              Men may shave face and neck.   Do not bring valuables to the hospital. [facility] IS NOT             RESPONSIBLE   FOR VALUABLES.  DO NOT BRING YOUR HOME MEDICATIONS TO THE HOSPITAL. PHARMACY WILL DISPENSE MEDICATIONS LISTED ON YOUR MEDICATION LIST TO YOU DURING YOUR ADMISSION IN THE HOSPITAL!    Patients discharged on the day of surgery will not be allowed to drive home.  Someone NEEDS to stay with you for the first 24 hours after anesthesia.  Please read over the following fact sheets you were given: IF YOU HAVE QUESTIONS ABOUT YOUR PRE-OP INSTRUCTIONS PLEASE CALL [phone] [place]ras   If you received a COVID test during your pre-op visit  it is requested that you wear a mask when out in public, stay away from anyone that may not be feeling well and notify your surgeon if you develop symptoms. If you test positive for  Covid or have been in contact with anyone that has tested positive in the last 10 days please notify you surgeon.     [facility] - Preparing for Surgery Before surgery, you can play an important role.  Because skin is not sterile, your skin needs to be as free of germs as possible.  You can reduce the number of germs on your skin by washing with CHG (chlorahexidine gluconate) soap before surgery.  CHG is an antiseptic cleaner which kills germs and bonds with the skin to continue killing germs even after washing. Please DO NOT use if you have an allergy to CHG or antibacterial soaps.  If your skin becomes reddened/irritated stop using the CHG and inform your nurse when you arrive at Short Stay. Do not shave (including legs and underarms) for at least 48 hours prior to the first CHG shower.  You may shave your face/neck.  Please follow these instructions carefully:  1.  Shower with CHG Soap the night before surgery and the  morning of surgery.  2.  If you choose to wash your hair, wash your hair first as usual with your normal  shampoo.  3.  After you shampoo, rinse your hair and body thoroughly to remove the shampoo.                             4.  Use CHG as you would any other liquid soap.  You can apply chg directly to the skin and wash.  Gently with a scrungie or clean washcloth.  5.  Apply the CHG Soap to your body ONLY FROM THE NECK DOWN.   Do   not use on face/ open                           Wound or open sores. Avoid contact with eyes, ears mouth and   genitals (private parts).                       Wash face,  Genitals (private parts) with your normal soap.             6.  Wash thoroughly, paying special attention to the area where your    surgery  will be performed.  7.  Thoroughly rinse your body with warm water from the neck down.  8.  DO NOT shower/wash with your normal soap after using and rinsing off the CHG Soap.                9.  Pat yourself dry with a clean towel.             10.  Wear clean pajamas.            11.  Place clean sheets on your bed the night of your first shower and do not  sleep with pets. Day of Surgery : Do not apply any lotions/deodorants the morning of surgery.  Please wear clean clothes to the  hospital/surgery center.  FAILURE TO FOLLOW THESE INSTRUCTIONS MAY RESULT IN THE CANCELLATION OF YOUR SURGERY  PATIENT SIGNATURE_________________________________  NURSE SIGNATURE__________________________________  ________________________________________________________________________

## 2022-03-19 NOTE — Progress Notes (Signed)
Anesthesia Chart Review   Case: [id number] Date/Time: 03/20/22 1330   Procedure: LAPAROSCOPIC ASSISTED REPAIR UMBILICAL HERNIA WITH MESH   Anesthesia type: General   Pre-op diagnosis: UMBILICAL HERNIA   Location: [facility] ROOM 02 / [facility]   Surgeons: Gaynelle Adu, MD       DISCUSSION:61 y.o. never smoker with h/o HTN, OSA, paroxysmal atrial fibrillation, DM II, umbilical hernia scheduled for above procedure 03/20/2022 with Dr. Gaynelle Adu.   Pt last seen by cardiology 11/29/2021. Per OV note pt experiencing chest pain with atypical and typcal features.  Stress Echo ordered at this visit.   Stress Echo with normal LV function, no inducible ischemia.  Cleared for surgery from cardiology.   Anticipate pt can proceed with planned procedure barring acute status change.   VS: BP 121/76   Pulse 70   Temp 36.7 C (Oral)   Resp 12   Ht 5\' 10"  (1.778 m)   Wt 84.3 kg   SpO2 100%   BMI 26.66 kg/m   PROVIDERS: , MD is PCP   Monica Becton, MD is Cardiologist  LABS: Labs reviewed: Acceptable for surgery. (all labs ordered are listed, but only abnormal results are displayed)  Labs Reviewed  HEMOGLOBIN A1C - Abnormal; Notable for the following components:      Result Value   Hgb A1c MFr Bld 4.4 (*)    All other components within normal limits  BASIC METABOLIC PANEL - Abnormal; Notable for the following components:   BUN 29 (*)    All other components within normal limits  CBC - Abnormal; Notable for the following components:   Platelets 145 (*)    All other components within normal limits  GLUCOSE, CAPILLARY     IMAGES:   EKG:   CV: Stress Echo 01/10/2022 REST ECHO  Normal left ventricular function at rest. There was normal left  ventricular wall motion at rest. There were no segmental wall motion  abnormalities at rest.  -  STRESS ECHO  Normal left ventricular function and global wall motion with stress.  There were no segmental wall motion  abnormalities post exercise. There  was normal increase in global LV function post exercise. The estimated  LV ejection fraction is >70% with stress. Negative exercise  echocardiography for inducible ischemia at target heart rate.  Past Medical History:  Diagnosis Date   Allergy    Arthritis    Atrial fibrillation (HCC)    Bulging lumbar disc    Diabetes mellitus without complication (HCC)    Hypertension    OSA (obstructive sleep apnea)    Pneumonia     Past Surgical History:  Procedure Laterality Date   KNEE ARTHROPLASTY     pt reports just arthroscopy   NASAL SEPTUM SURGERY     VASECTOMY      MEDICATIONS:  albuterol (PROVENTIL HFA;VENTOLIN HFA) 108 (90 Base) MCG/ACT inhaler   allopurinol (ZYLOPRIM) 300 MG tablet   AMBULATORY NON FORMULARY MEDICATION   aspirin EC 81 MG tablet   Calcium Citrate (CITRACAL PO)   Dapagliflozin-metFORMIN HCl ER (XIGDUO XR) 03-999 MG TB24   diclofenac (VOLTAREN) 75 MG EC tablet   flecainide (TAMBOCOR) 50 MG tablet   gabapentin (NEURONTIN) 300 MG capsule   ipratropium (ATROVENT) 0.03 % nasal spray   loratadine (CLARITIN) 10 MG tablet   metoprolol succinate (TOPROL-XL) 25 MG 24 hr tablet   MOUNJARO 7.5 MG/0.5ML Pen   Multiple Vitamins-Minerals (CENTRUM SILVER ADULT 50+) TABS   NEEDLE, DISP, 23 G (  BD DISP NEEDLE) 23G X 1" MISC   rosuvastatin (CRESTOR) 10 MG tablet   SYRINGE-NEEDLE, DISP, 3 ML (B-D 3CC LUER-LOK SYR 18GX1-1/2) 18G X 1-1/2" 3 ML MISC   testosterone cypionate (DEPOTESTOSTERONE CYPIONATE) 200 MG/ML injection   tirzepatide (MOUNJARO) 10 MG/0.5ML Pen   tirzepatide (MOUNJARO) 12.5 MG/0.5ML Pen   tirzepatide (MOUNJARO) 5 MG/0.5ML Pen   triamcinolone ointment (KENALOG) 0.5 %   No current facility-administered medications for this encounter.   Konrad Felix Ward, PA-C [facility] [phone]

## 2022-03-20 ENCOUNTER — Ambulatory Visit (HOSPITAL_COMMUNITY)
Admission: RE | Admit: 2022-03-20 | Discharge: 2022-03-20 | Disposition: A | Discharge status: Home / Self Care | Payer: BC Managed Care – PPO | Attending: General Surgery | Admitting: General Surgery

## 2022-03-20 ENCOUNTER — Other Ambulatory Visit: Payer: Self-pay

## 2022-03-20 ENCOUNTER — Ambulatory Visit (HOSPITAL_COMMUNITY): Payer: BC Managed Care – PPO | Admitting: Physician Assistant

## 2022-03-20 ENCOUNTER — Ambulatory Visit (HOSPITAL_COMMUNITY): Payer: BC Managed Care – PPO | Admitting: Anesthesiology

## 2022-03-20 ENCOUNTER — Encounter (HOSPITAL_COMMUNITY): Payer: Self-pay | Admitting: General Surgery

## 2022-03-20 ENCOUNTER — Encounter (HOSPITAL_COMMUNITY)
Admission: RE | Disposition: A | Discharge status: Home / Self Care | Payer: Self-pay | Source: Home / Self Care | Attending: General Surgery

## 2022-03-20 DIAGNOSIS — I4891 Unspecified atrial fibrillation: Secondary | ICD-10-CM | POA: Diagnosis not present

## 2022-03-20 DIAGNOSIS — G4730 Sleep apnea, unspecified: Secondary | ICD-10-CM | POA: Insufficient documentation

## 2022-03-20 DIAGNOSIS — Z7984 Long term (current) use of oral hypoglycemic drugs: Secondary | ICD-10-CM | POA: Diagnosis not present

## 2022-03-20 DIAGNOSIS — E119 Type 2 diabetes mellitus without complications: Secondary | ICD-10-CM | POA: Diagnosis not present

## 2022-03-20 DIAGNOSIS — I10 Essential (primary) hypertension: Secondary | ICD-10-CM | POA: Diagnosis not present

## 2022-03-20 DIAGNOSIS — K429 Umbilical hernia without obstruction or gangrene: Secondary | ICD-10-CM | POA: Insufficient documentation

## 2022-03-20 HISTORY — PX: UMBILICAL HERNIA REPAIR: SHX196

## 2022-03-20 LAB — GLUCOSE, CAPILLARY
Glucose-Capillary: 106 mg/dL — ABNORMAL HIGH (ref 70–99)
Glucose-Capillary: 104 mg/dL — ABNORMAL HIGH (ref 70–99)

## 2022-03-20 SURGERY — REPAIR, HERNIA, UMBILICAL, LAPAROSCOPIC
Anesthesia: General

## 2022-03-20 MED ORDER — FENTANYL CITRATE (PF) 250 MCG/5ML IJ SOLN
INTRAMUSCULAR | Status: AC
  Filled 2022-03-20: qty 5

## 2022-03-20 MED ORDER — 0.9 % SODIUM CHLORIDE (POUR BTL) OPTIME
TOPICAL | Status: DC | PRN
  Administered 2022-03-20: 1000 mL

## 2022-03-20 MED ORDER — VANCOMYCIN HCL 1500 MG/300ML IV SOLN
1500.0000 mg | INTRAVENOUS | Status: AC
  Administered 2022-03-20: 1500 mg via INTRAVENOUS
  Filled 2022-03-20: qty 300

## 2022-03-20 MED ORDER — ONDANSETRON HCL 4 MG/2ML IJ SOLN
INTRAMUSCULAR | Status: DC | PRN
  Administered 2022-03-20: 4 mg via INTRAVENOUS

## 2022-03-20 MED ORDER — ORAL CARE MOUTH RINSE: 15.0000 mL | Freq: Once | OROMUCOSAL | Status: AC

## 2022-03-20 MED ORDER — BUPIVACAINE-EPINEPHRINE 0.25% -1:200000 IJ SOLN
INTRAMUSCULAR | Status: DC | PRN
  Administered 2022-03-20: 30 mL

## 2022-03-20 MED ORDER — BUPIVACAINE LIPOSOME 1.3 % IJ SUSP: 20.0000 mL | Freq: Once | INTRAMUSCULAR | Status: DC

## 2022-03-20 MED ORDER — LIDOCAINE 2% (20 MG/ML) 5 ML SYRINGE
INTRAMUSCULAR | Status: DC | PRN
  Administered 2022-03-20: 80 mg via INTRAVENOUS
  Administered 2022-03-20: 1.5 mg/kg/h via INTRAVENOUS

## 2022-03-20 MED ORDER — DEXAMETHASONE SODIUM PHOSPHATE 10 MG/ML IJ SOLN
INTRAMUSCULAR | Status: DC | PRN
  Administered 2022-03-20: 10 mg via INTRAVENOUS

## 2022-03-20 MED ORDER — ONDANSETRON HCL 4 MG/2ML IJ SOLN
INTRAMUSCULAR | Status: AC
  Filled 2022-03-20: qty 2

## 2022-03-20 MED ORDER — CHLORHEXIDINE GLUCONATE 0.12 % MT SOLN
15.0000 mL | Freq: Once | OROMUCOSAL | Status: AC
  Administered 2022-03-20: 15 mL via OROMUCOSAL

## 2022-03-20 MED ORDER — OXYCODONE HCL 5 MG PO TABS: 5.0000 mg | ORAL_TABLET | Freq: Four times a day (QID) | ORAL | 0 refills | Status: DC | PRN

## 2022-03-20 MED ORDER — PROPOFOL 10 MG/ML IV BOLUS
INTRAVENOUS | Status: AC
  Filled 2022-03-20: qty 20

## 2022-03-20 MED ORDER — CHLORHEXIDINE GLUCONATE CLOTH 2 % EX PADS: 6.0000 | MEDICATED_PAD | Freq: Once | CUTANEOUS | Status: DC

## 2022-03-20 MED ORDER — LIDOCAINE HCL (PF) 2 % IJ SOLN
INTRAMUSCULAR | Status: AC
  Filled 2022-03-20: qty 25

## 2022-03-20 MED ORDER — HYDROMORPHONE HCL 1 MG/ML IJ SOLN: 0.2500 mg | INTRAMUSCULAR | Status: DC | PRN

## 2022-03-20 MED ORDER — ROCURONIUM BROMIDE 10 MG/ML (PF) SYRINGE
PREFILLED_SYRINGE | INTRAVENOUS | Status: DC | PRN
  Administered 2022-03-20: 70 mg via INTRAVENOUS

## 2022-03-20 MED ORDER — FENTANYL CITRATE (PF) 100 MCG/2ML IJ SOLN
INTRAMUSCULAR | Status: DC | PRN
  Administered 2022-03-20: 100 ug via INTRAVENOUS

## 2022-03-20 MED ORDER — PHENYLEPHRINE 80 MCG/ML (10ML) SYRINGE FOR IV PUSH (FOR BLOOD PRESSURE SUPPORT)
PREFILLED_SYRINGE | INTRAVENOUS | Status: DC | PRN
  Administered 2022-03-20: 80 ug via INTRAVENOUS

## 2022-03-20 MED ORDER — LACTATED RINGERS IV SOLN: INTRAVENOUS | Status: DC | PRN

## 2022-03-20 MED ORDER — KETOROLAC TROMETHAMINE 30 MG/ML IJ SOLN
INTRAMUSCULAR | Status: AC
  Filled 2022-03-20: qty 1

## 2022-03-20 MED ORDER — BUPIVACAINE LIPOSOME 1.3 % IJ SUSP
INTRAMUSCULAR | Status: AC
  Filled 2022-03-20: qty 20

## 2022-03-20 MED ORDER — BUPIVACAINE-EPINEPHRINE (PF) 0.25% -1:200000 IJ SOLN
INTRAMUSCULAR | Status: AC
  Filled 2022-03-20: qty 30

## 2022-03-20 MED ORDER — LACTATED RINGERS IV SOLN: INTRAVENOUS | Status: DC

## 2022-03-20 MED ORDER — ACETAMINOPHEN 500 MG PO TABS: 1000.0000 mg | ORAL_TABLET | Freq: Three times a day (TID) | ORAL | 0 refills | Status: AC

## 2022-03-20 MED ORDER — KETOROLAC TROMETHAMINE 30 MG/ML IJ SOLN
INTRAMUSCULAR | Status: DC | PRN
  Administered 2022-03-20: 30 mg via INTRAVENOUS

## 2022-03-20 MED ORDER — BUPIVACAINE LIPOSOME 1.3 % IJ SUSP
INTRAMUSCULAR | Status: DC | PRN
  Administered 2022-03-20: 20 mL

## 2022-03-20 MED ORDER — MIDAZOLAM HCL 2 MG/2ML IJ SOLN
INTRAMUSCULAR | Status: AC
  Filled 2022-03-20: qty 2

## 2022-03-20 MED ORDER — MIDAZOLAM HCL 5 MG/5ML IJ SOLN
INTRAMUSCULAR | Status: DC | PRN
  Administered 2022-03-20: 2 mg via INTRAVENOUS

## 2022-03-20 MED ORDER — PROPOFOL 10 MG/ML IV BOLUS
INTRAVENOUS | Status: DC | PRN
  Administered 2022-03-20: 160 mg via INTRAVENOUS

## 2022-03-20 MED ORDER — SUGAMMADEX SODIUM 200 MG/2ML IV SOLN
INTRAVENOUS | Status: DC | PRN
  Administered 2022-03-20: 200 mg via INTRAVENOUS

## 2022-03-20 MED ORDER — ACETAMINOPHEN 500 MG PO TABS
1000.0000 mg | ORAL_TABLET | ORAL | Status: AC
  Administered 2022-03-20: 1000 mg via ORAL
  Filled 2022-03-20: qty 2

## 2022-03-20 SURGICAL SUPPLY — 37 items
BAG COUNTER SPONGE SURGICOUNT (BAG) IMPLANT
BINDER ABDOMINAL 12 ML 46-62 (SOFTGOODS) IMPLANT
CABLE HIGH FREQUENCY MONO STRZ (ELECTRODE) IMPLANT
CHLORAPREP W/TINT 26 (MISCELLANEOUS) ×1 IMPLANT
COVER SURGICAL LIGHT HANDLE (MISCELLANEOUS) ×1 IMPLANT
DERMABOND ADVANCED .7 DNX12 (GAUZE/BANDAGES/DRESSINGS) IMPLANT
DEVICE SECURE STRAP 25 ABSORB (INSTRUMENTS) IMPLANT
DEVICE TROCAR PUNCTURE CLOSURE (ENDOMECHANICALS) IMPLANT
DRAPE INCISE IOBAN 66X45 STRL (DRAPES) IMPLANT
ELECT HOOK LOOP BIPOLAR (NEEDLE) IMPLANT
ELECT REM PT RETURN 15FT ADLT (MISCELLANEOUS) ×1 IMPLANT
GLOVE BIO SURGEON STRL SZ7.5 (GLOVE) ×1 IMPLANT
GLOVE INDICATOR 8.0 STRL GRN (GLOVE) ×1 IMPLANT
GOWN STRL REUS W/ TWL XL LVL3 (GOWN DISPOSABLE) ×3 IMPLANT
GOWN STRL REUS W/TWL XL LVL3 (GOWN DISPOSABLE) ×3
IRRIG SUCT STRYKERFLOW 2 WTIP (MISCELLANEOUS)
IRRIGATION SUCT STRKRFLW 2 WTP (MISCELLANEOUS) IMPLANT
KIT BASIN OR (CUSTOM PROCEDURE TRAY) ×1 IMPLANT
KIT TURNOVER KIT A (KITS) IMPLANT
MARKER SKIN DUAL TIP RULER LAB (MISCELLANEOUS) ×1 IMPLANT
MESH VENTRALEX ST 8CM LRG (Mesh General) IMPLANT
NDL SPNL 22GX3.5 QUINCKE BK (NEEDLE) IMPLANT
NEEDLE SPNL 22GX3.5 QUINCKE BK (NEEDLE) IMPLANT
PENCIL SMOKE EVACUATOR (MISCELLANEOUS) IMPLANT
SCISSORS LAP 5X35 DISP (ENDOMECHANICALS) ×1 IMPLANT
SET TUBE SMOKE EVAC HIGH FLOW (TUBING) ×1 IMPLANT
SHEARS HARMONIC ACE PLUS 36CM (ENDOMECHANICALS) IMPLANT
SLEEVE Z-THREAD 5X100MM (TROCAR) ×1 IMPLANT
SPIKE FLUID TRANSFER (MISCELLANEOUS) ×1 IMPLANT
SUT MNCRL AB 4-0 PS2 18 (SUTURE) ×1 IMPLANT
SUT NOVA 1 T20/GS 25DT (SUTURE) ×2 IMPLANT
SUT VIC AB 3-0 SH 18 (SUTURE) ×1 IMPLANT
SUT VICRYL 0 UR6 27IN ABS (SUTURE) IMPLANT
TOWEL OR 17X26 10 PK STRL BLUE (TOWEL DISPOSABLE) ×1 IMPLANT
TRAY LAPAROSCOPIC (CUSTOM PROCEDURE TRAY) ×1 IMPLANT
TROCAR 11X100 Z THREAD (TROCAR) ×1 IMPLANT
TROCAR Z-THREAD OPTICAL 5X100M (TROCAR) ×1 IMPLANT

## 2022-03-20 NOTE — Op Note (Signed)
William Ortega [id number] 1961/02/10 03/20/2022   Laparoscopic Assisted Repair of Umbilical Hernia with Mesh Procedure Note; laparoscopic bilateral tap block  Indications: Symptomatic umbilical hernia  Pre-operative Diagnosis: umbilical hernia  Post-operative Diagnosis: umbilical hernia (1.5 cm x 1.5 cm)  Surgeon: Greer Pickerel MD FACS  Assistants: none  Anesthesia: General endotracheal anesthesia   Procedure Details  The patient was seen in the Holding Room. The risks, benefits, complications, treatment options, and expected outcomes were discussed with the patient. The possibilities of reaction to medication, pulmonary aspiration, perforation of viscus, bleeding, recurrent infection, the need for additional procedures, failure to diagnose a condition, and creating a complication requiring transfusion or operation were discussed with the patient. The patient concurred with the proposed plan, giving informed consent.  The site of surgery properly noted/marked. The patient was taken to the operating room, identified as William Ortega  and the procedure verified as laparoscopic assisted repair of umbilical hernia with mesh. A Time Out was held and the above information confirmed.    The patient was placed supine.  After establishing general anesthesia, Left arm tucked with the appropriate padding.  The abdomen was prepped with Chloraprep and draped in standard fashion.  A 5 mm Optiview was used the cannulate the peritoneal cavity in the left upper quadrant below the costal margin.  Pneumoperitoneum was obtained by insufflating CO2, maintaining a maximum pressure of 15 mmHg.  The 5 mm 30-degree laparoscopic was inserted.  There were no significant omental adhesions to the anterior abdominal wall in and around the hernia defect.   Another 5-mm port was placed in the left lateral abdominal wall.  I then took down the falciform ligament with hook electrocautery.   We visualized 1 fascial defect.   I  then proceeded with the open portion of the procedure.  A curvilinear infraumbilical incision was created. Dissection was carried down to the hernia sac located above the fascia and was mobilized from surrounding structures. Intact fascia was identified circumferentially around the defect. .  Skin and soft tissue was mobilized from the surface of the fascia in a circumferential manner. A finger sweep was performed underneath the fascia to ensure there were no adhesions to the anterior abdominal wall.  I then measured the defect.  It measured 1.5 cm x 1.5 cm.  .  Therefore I selected a round piece of Bard ventralex ST hernia patch mesh measuring 8cm.  The tabs were trimmed. I placed a central stay suture in the mesh with a 0 novafil.   The mesh was then rolled up and inserted through the fascial defect. The fascia was then closed primarily over the mesh with 4 interrupted 0-novafil sutures.   I then returned laparoscopically.  I then infiltrated Exparel in a regional fashion in the preperitoneal space around the umbilical fascial closure and around the expected locations of where the where the secure strap tacks would be placed.  I also performed a laparoscopic bilateral tap block for postoperative pain relief. The mesh was then unrolled.  The central stay suture was then pulled up through the open infraumbilical incision using the PMI suture device.  This deployed the mesh widely over the fascial defect.  The stay suture was then tied down.  The Secure Strap device was then used to tack down the edges of the mesh at  circumferentially. We placed a few tacks inside the outer ring of tacks.  We inspected for hemostasis.   Pneumoperitoneum was then released as we removed the remainder  of the trocars.   The umbilical stalk was then tacked back down to the fascia with a 3-0 vicryl suture.  The port sites and umbilical incision were closed with 4-0 Monocryl.  All of the incisions and stay suture sites were then  covered  steri-strips and bandages.  An abdominal binder was placed around the patient's abdomen.  The patient was extubated and brought to the recovery room in stable condition.  All sponge, instrument, and needle counts were correct prior to closure and at the conclusion of the case.   Findings: Type of repair - primary suture with mesh underlay Name of mesh - Bard VentralexST 8cm  Size of mesh - Round 8.4 cm  Mesh overlap - >5 cm  Placement of mesh - beneath fascia and into peritoneal cavity,  Estimated Blood Loss:  Minimal         Complications:  None; patient tolerated the procedure well.         Disposition: PACU - hemodynamically stable.         Condition: stable  William Ortega. Redmond Pulling, MD, FACS General, Bariatric, & Minimally Invasive Surgery [facility],  [place]

## 2022-03-20 NOTE — Discharge Instructions (Signed)
[facility] LAPAROSCOPIC SURGERY: POST OP INSTRUCTIONS Always review your discharge instruction sheet given to you by the facility where your surgery was performed. IF YOU HAVE DISABILITY OR FAMILY LEAVE FORMS, YOU MUST BRING THEM TO THE OFFICE FOR PROCESSING.   DO NOT GIVE THEM TO YOUR DOCTOR.  PAIN CONTROL  First take acetaminophen (Tylenol) AND/or ibuprofen (Advil) to control your pain after surgery.  Follow directions on package.  Taking acetaminophen (Tylenol) and/or ibuprofen (Advil) regularly after surgery will help to control your pain and lower the amount of prescription pain medication you may need.  You should not take more than 3,000 mg (3 grams) of acetaminophen (Tylenol) in 24 hours.  You should not take ibuprofen (Advil), aleve, motrin, naprosyn or other NSAIDS if you have a history of stomach ulcers or chronic kidney disease.  A prescription for pain medication may be given to you upon discharge.  Take your pain medication as prescribed, if you still have uncontrolled pain after taking acetaminophen (Tylenol) or ibuprofen (Advil). Use ice packs to help control pain. If you need a refill on your pain medication, please contact your pharmacy.  They will contact our office to request authorization. Prescriptions will not be filled after 5pm or on week-ends.  HOME MEDICATIONS Take your usually prescribed medications unless otherwise directed.  DIET You should follow a light diet the first few days after arrival home.  Be sure to include lots of fluids daily. Avoid fatty, fried foods.   CONSTIPATION It is common to experience some constipation after surgery and if you are taking pain medication.  Increasing fluid intake and taking a stool softener (such as Colace) will usually help or prevent this problem from occurring.  A mild laxative (Milk of Magnesia or Miralax) should be taken according to package instructions if there are no bowel movements after 48  hours.  WOUND/INCISION CARE Most patients will experience some swelling and bruising in the area of the incisions.  Ice packs will help.  Swelling and bruising can take several days to resolve.  Unless discharge instructions indicate otherwise, follow guidelines below  STERI-STRIPS - you may remove your outer bandages 48 hours after surgery, and you may shower at that time.  You have steri-strips (small skin tapes) in place directly over the incision.  These strips should be left on the skin for 7-10 days.   DERMABOND/SKIN GLUE - you may shower in 24 hours.  The glue will flake off over the next 2-3 weeks. Any sutures or staples will be removed at the office during your follow-up visit.  ACTIVITIES You may resume regular (light) daily activities beginning the next day--such as daily self-care, walking, climbing stairs--gradually increasing activities as tolerated.  You may have sexual intercourse when it is comfortable.  Refrain from any heavy lifting or straining until approved by your doctor. You may drive when you are no longer taking prescription pain medication, you can comfortably wear a seatbelt, and you can safely maneuver your car and apply brakes.  FOLLOW-UP You should see your doctor in the office for a follow-up appointment approximately 2-3 weeks after your surgery.  You should have been given your post-op/follow-up appointment when your surgery was scheduled.  If you did not receive a post-op/follow-up appointment, make sure that you call for this appointment within a day or two after you arrive home to insure a convenient appointment time.  OTHER INSTRUCTIONS No heavy lifting for 4 weeks  WHEN TO CALL YOUR DOCTOR: Fever over  101.0 Inability to urinate Continued bleeding from incision. Increased pain, redness, or drainage from the incision. Increasing abdominal pain  The clinic staff is available to answer your questions during regular business hours.  Please don't hesitate to  call and ask to speak to one of the nurses for clinical concerns.  If you have a medical emergency, go to the nearest emergency room or call 911.  A surgeon from [facility] is always on call at the hospital. [address] [place], [place], [place]  [address] ? [address], [place], [place]   [address] [phone] ? [phone] ? FAX [phone] Web site: [email or web address]

## 2022-03-20 NOTE — Anesthesia Postprocedure Evaluation (Signed)
Anesthesia Post Note  Patient: William Ortega  Procedure(s) Performed: LAPAROSCOPIC ASSISTED REPAIR UMBILICAL HERNIA WITH MESH     Patient location during evaluation: PACU Anesthesia Type: General Level of consciousness: awake and alert Pain management: pain level controlled Vital Signs Assessment: post-procedure vital signs reviewed and stable Respiratory status: spontaneous breathing, nonlabored ventilation and respiratory function stable Cardiovascular status: blood pressure returned to baseline and stable Postop Assessment: no apparent nausea or vomiting Anesthetic complications: no   No notable events documented.  Last Vitals:  Vitals:   03/20/22 1815 03/20/22 1832  BP: (!) 146/78 136/85  Pulse: 71 72  Resp: 15 16  Temp: 36.5 C 36.5 C  SpO2: 99% 99%    Last Pain:  Vitals:   03/20/22 1832  TempSrc: Oral  PainSc: 0-No pain                 [name],W. EDMOND

## 2022-03-20 NOTE — H&P (Signed)
CC: here for surgery  Requesting provider: Dr Ruby Cola  HPI: JAVOHN BASEY is an 61 y.o. male who is here for lap assisted repair of umbilical hernia with mesh. Denies changes since seen in clinic during the summer other than planned weight loss.    No trips to ED. No cp/sob/doe. No blood thinner  Old hpi: Kirin Pastorino is a 61 y.o. male who is seen today as an office consultation at the request of Dr. Dianah Field for evaluation of hernia.   Comorbidities include well-controlled hypertension, diabetes mellitus type 2, obstructive sleep apnea, and paroxysmal atrial fibrillation  He states that he has had a bulge at his bellybutton for quite some time however it is recently become more uncomfortable at times. If he leans up against something it will be uncomfortable. He states that his grandson inadvertently kicked him around the bellybutton he had severe pain for a few hours afterwards. No nausea or vomiting. No diarrhea or constipation.  He does endorse some chest tightness and some occasional times where it is hard to breathe. He states that he started experiencing this or noticing it after he had COVID a few times. No TIAs or amaurosis fugax.  He was recently placed on Mounjaro about 3 weeks ago and reports minimal side effects. He states he is down about 9 to 10 pounds.  Past Medical History:  Diagnosis Date   Allergy    Arthritis    Atrial fibrillation ([place])    Bulging lumbar disc    Diabetes mellitus without complication (HCC)    Hypertension    OSA (obstructive sleep apnea)    Pneumonia     Past Surgical History:  Procedure Laterality Date   KNEE ARTHROPLASTY     pt reports just arthroscopy   NASAL SEPTUM SURGERY     VASECTOMY      Family History  Problem Relation Age of Onset   Heart disease Mother    Stroke Paternal Grandmother     Social:  reports that he has never smoked. He has never used smokeless tobacco. He reports current alcohol use. He reports that he does  not use drugs.  Allergies:  Allergies  Allergen Reactions   Aluminum Rash   Nickel Rash   Penicillins Rash    Medications: I have reviewed the patient's current medications.   ROS - all of the below systems have been reviewed with the patient and positives are indicated with bold text General: chills, fever or night sweats Eyes: blurry vision or double vision ENT: epistaxis or sore throat Allergy/Immunology: itchy/watery eyes or nasal congestion Hematologic/Lymphatic: bleeding problems, blood clots or swollen lymph nodes Endocrine: temperature intolerance or unexpected weight changes Breast: new or changing breast lumps or nipple discharge Resp: cough, shortness of breath, or wheezing CV: chest pain or dyspnea on exertion GI: as per HPI GU: dysuria, trouble voiding, or hematuria MSK: joint pain or joint stiffness Neuro: TIA or stroke symptoms Derm: pruritus and skin lesion changes Psych: anxiety and depression  PE Blood pressure 124/86, pulse 79, temperature 98 F (36.7 C), temperature source Oral, resp. rate 13, height 5\' 10"  (1.778 m), weight 84 kg, SpO2 100 %. Constitutional: NAD; conversant; no deformities Eyes: Moist conjunctiva; no lid lag; anicteric; PERRL Neck: Trachea midline; no thyromegaly Lungs: Normal respiratory effort; no tactile fremitus CV: RRR; no palpable thrills; no pitting edema GI: Abd soft, nt, nd, umbilical hernia - soft; no palpable hepatosplenomegaly MSK: Normal gait; no clubbing/cyanosis Psychiatric: Appropriate affect; alert and oriented x3 Lymphatic:  No palpable cervical or axillary lymphadenopathy Skin:no rash/lesions  Results for orders placed or performed during the hospital encounter of 03/20/22 (from the past 48 hour(s))  Glucose, capillary     Status: Abnormal   Collection Time: 03/20/22 11:34 AM  Result Value Ref Range   Glucose-Capillary 106 (H) 70 - 99 mg/dL    Comment: Glucose reference range applies only to samples taken after  fasting for at least 8 hours.   Comment 1 Notify RN    Comment 2 Document in Chart     No results found.  Imaging:  A/P: REGINALDO BRINTON is an 61 y.o. male with  Umbilical hernia  DM2 PAF OSA   To OR for lap assisted repair of umbilical hernia with mesh ERAS IV abx All questions asked and answered  Leighton Ruff. Redmond Pulling, MD, FACS General, Bariatric, & Minimally Invasive Surgery [place]

## 2022-03-20 NOTE — Anesthesia Procedure Notes (Signed)
Procedure Name: Intubation Date/Time: 03/20/2022 4:38 PM  Performed by: Gean Maidens, CRNAPre-anesthesia Checklist: Patient identified, Emergency Drugs available, Suction available, Patient being monitored and Timeout performed Patient Re-evaluated:Patient Re-evaluated prior to induction Oxygen Delivery Method: Circle system utilized Preoxygenation: Pre-oxygenation with 100% oxygen Induction Type: IV induction Ventilation: Mask ventilation without difficulty Laryngoscope Size: Mac and 4 Grade View: Grade I Tube type: Oral Tube size: 7.0 mm Number of attempts: 1 Airway Equipment and Method: Stylet Placement Confirmation: ETT inserted through vocal cords under direct vision, positive ETCO2 and breath sounds checked- equal and bilateral Secured at: 21 cm Tube secured with: Tape Dental Injury: Teeth and Oropharynx as per pre-operative assessment

## 2022-03-20 NOTE — Anesthesia Preprocedure Evaluation (Addendum)
Anesthesia Evaluation  Patient identified by MRN, date of birth, ID band Patient awake    Reviewed: Allergy & Precautions, H&P , NPO status , Patient's Chart, lab work & pertinent test results  Airway Mallampati: III  TM Distance: >3 FB Neck ROM: Full    Dental no notable dental hx. (+) Teeth Intact, Dental Advisory Given   Pulmonary neg shortness of breath, asthma , sleep apnea and Continuous Positive Airway Pressure Ventilation ,    Pulmonary exam normal breath sounds clear to auscultation       Cardiovascular hypertension, Pt. on medications + dysrhythmias Atrial Fibrillation  Rhythm:Regular Rate:Normal     Neuro/Psych negative neurological ROS  negative psych ROS   GI/Hepatic negative GI ROS, Neg liver ROS,   Endo/Other  diabetes, Type 2, Oral Hypoglycemic Agents  Renal/GU negative Renal ROS  negative genitourinary   Musculoskeletal   Abdominal   Peds  Hematology negative hematology ROS (+)   Anesthesia Other Findings   Reproductive/Obstetrics negative OB ROS                            Anesthesia Physical Anesthesia Plan  ASA: 3  Anesthesia Plan: General   Post-op Pain Management: Tylenol PO (pre-op)*   Induction: Intravenous  PONV Risk Score and Plan: 3 and Ondansetron, Dexamethasone and Midazolam  Airway Management Planned: Oral ETT  Additional Equipment:   Intra-op Plan:   Post-operative Plan: Extubation in OR  Informed Consent: I have reviewed the patients History and Physical, chart, labs and discussed the procedure including the risks, benefits and alternatives for the proposed anesthesia with the patient or authorized representative who has indicated his/her understanding and acceptance.     Dental advisory given  Plan Discussed with: CRNA  Anesthesia Plan Comments:         Anesthesia Quick Evaluation

## 2022-03-20 NOTE — Transfer of Care (Signed)
Immediate Anesthesia Transfer of Care Note  Patient: William Ortega  Procedure(s) Performed: LAPAROSCOPIC ASSISTED REPAIR UMBILICAL HERNIA WITH MESH  Patient Location: PACU  Anesthesia Type:General  Level of Consciousness: sedated, patient cooperative and responds to stimulation  Airway & Oxygen Therapy: Patient Spontanous Breathing and Patient connected to face mask oxygen  Post-op Assessment: Report given to RN and Post -op Vital signs reviewed and stable  Post vital signs: Reviewed and stable  Last Vitals:  Vitals Value Taken Time  BP 155/91 03/20/22 1746  Temp    Pulse 68 03/20/22 1748  Resp 21 03/20/22 1748  SpO2 100 % 03/20/22 1748  Vitals shown include unvalidated device data.  Last Pain:  Vitals:   03/20/22 1154  TempSrc:   PainSc: 0-No pain         Complications: No notable events documented.

## 2022-03-21 ENCOUNTER — Encounter (HOSPITAL_COMMUNITY): Payer: Self-pay | Admitting: General Surgery

## 2022-03-23 ENCOUNTER — Other Ambulatory Visit: Payer: Self-pay | Admitting: Sports Medicine

## 2022-03-23 DIAGNOSIS — E119 Type 2 diabetes mellitus without complications: Secondary | ICD-10-CM

## 2022-03-31 ENCOUNTER — Other Ambulatory Visit: Payer: Self-pay | Admitting: Sports Medicine

## 2022-03-31 DIAGNOSIS — E119 Type 2 diabetes mellitus without complications: Secondary | ICD-10-CM

## 2022-04-01 LAB — HM DIABETES EYE EXAM

## 2022-04-04 ENCOUNTER — Ambulatory Visit: Payer: BC Managed Care – PPO | Admitting: Sports Medicine

## 2022-04-04 ENCOUNTER — Encounter: Payer: Self-pay | Admitting: Sports Medicine

## 2022-04-04 VITALS — BP 113/78 | HR 86 | Ht 70.0 in | Wt 185.0 lb

## 2022-04-04 DIAGNOSIS — Z23 Encounter for immunization: Secondary | ICD-10-CM

## 2022-04-04 DIAGNOSIS — E119 Type 2 diabetes mellitus without complications: Secondary | ICD-10-CM

## 2022-04-04 LAB — POCT GLYCOSYLATED HEMOGLOBIN (HGB A1C): Hemoglobin A1C: 4.8 % (ref 4.0–5.6)

## 2022-04-04 NOTE — Progress Notes (Signed)
    Procedures performed today:    None.  Independent interpretation of notes and tests performed by another provider:   None.  Brief History, Exam, Impression, and Recommendations:    Controlled type 2 diabetes mellitus without complication, without long-term current use of insulin (HCC) Continues to do really well, currently taking Mounjaro 10 mg, was unable to get the 12.5 or the 15 mg dosages. He has now lost about 50 pounds. A1c well controlled, I think we can discontinue Xigduo for now. He is going to continue [facility] and try to go up to the 12.5 and the 15 mg forms, he will let me know when to call them in.    ____________________________________________ Gwen Her. Dianah Field, M.D., ABFM., CAQSM., AME. Primary Care and Sports Medicine [facility]  Adjunct Professor of [place] [employer] [facility] of Medicine  Risk manager

## 2022-04-04 NOTE — Assessment & Plan Note (Signed)
Continues to do really well, currently taking Mounjaro 10 mg, was unable to get the 12.5 or the 15 mg dosages. He has now lost about 50 pounds. A1c well controlled, I think we can discontinue Xigduo for now. He is going to continue [facility] and try to go up to the 12.5 and the 15 mg forms, he will let me know when to call them in.

## 2022-04-05 DIAGNOSIS — G4733 Obstructive sleep apnea (adult) (pediatric): Secondary | ICD-10-CM | POA: Diagnosis not present

## 2022-04-08 ENCOUNTER — Encounter (INDEPENDENT_AMBULATORY_CARE_PROVIDER_SITE_OTHER): Payer: BC Managed Care – PPO | Admitting: Sports Medicine

## 2022-04-08 DIAGNOSIS — N139 Obstructive and reflux uropathy, unspecified: Secondary | ICD-10-CM

## 2022-04-08 MED ORDER — TAMSULOSIN HCL 0.4 MG PO CAPS: 0.4000 mg | ORAL_CAPSULE | Freq: Every day | ORAL | 3 refills | Status: DC

## 2022-04-08 NOTE — Telephone Encounter (Signed)
I spent 5 total minutes of online digital evaluation and management services in this patient-initiated request for online care. 

## 2022-04-17 DIAGNOSIS — K429 Umbilical hernia without obstruction or gangrene: Secondary | ICD-10-CM | POA: Diagnosis not present

## 2022-04-22 DIAGNOSIS — R0781 Pleurodynia: Secondary | ICD-10-CM | POA: Diagnosis not present

## 2022-04-22 DIAGNOSIS — S20211A Contusion of right front wall of thorax, initial encounter: Secondary | ICD-10-CM | POA: Diagnosis not present

## 2022-04-28 ENCOUNTER — Other Ambulatory Visit: Payer: Self-pay | Admitting: Sports Medicine

## 2022-04-28 DIAGNOSIS — N139 Obstructive and reflux uropathy, unspecified: Secondary | ICD-10-CM

## 2022-04-28 DIAGNOSIS — E119 Type 2 diabetes mellitus without complications: Secondary | ICD-10-CM

## 2022-04-28 DIAGNOSIS — G6289 Other specified polyneuropathies: Secondary | ICD-10-CM

## 2022-04-29 NOTE — Telephone Encounter (Signed)
Error

## 2022-05-05 DIAGNOSIS — G4733 Obstructive sleep apnea (adult) (pediatric): Secondary | ICD-10-CM | POA: Diagnosis not present

## 2022-05-08 DIAGNOSIS — G4733 Obstructive sleep apnea (adult) (pediatric): Secondary | ICD-10-CM | POA: Diagnosis not present

## 2022-05-18 ENCOUNTER — Other Ambulatory Visit: Payer: Self-pay | Admitting: Sports Medicine

## 2022-05-18 DIAGNOSIS — E119 Type 2 diabetes mellitus without complications: Secondary | ICD-10-CM

## 2022-05-18 DIAGNOSIS — E7800 Pure hypercholesterolemia, unspecified: Secondary | ICD-10-CM

## 2022-05-18 DIAGNOSIS — G6289 Other specified polyneuropathies: Secondary | ICD-10-CM

## 2022-05-18 DIAGNOSIS — N139 Obstructive and reflux uropathy, unspecified: Secondary | ICD-10-CM

## 2022-05-20 ENCOUNTER — Other Ambulatory Visit: Payer: Self-pay | Admitting: Sports Medicine

## 2022-05-20 DIAGNOSIS — E291 Testicular hypofunction: Secondary | ICD-10-CM

## 2022-05-28 ENCOUNTER — Encounter: Payer: Self-pay | Admitting: Sports Medicine

## 2022-05-28 DIAGNOSIS — E119 Type 2 diabetes mellitus without complications: Secondary | ICD-10-CM

## 2022-05-28 MED ORDER — MOUNJARO 12.5 MG/0.5ML ~~LOC~~ SOAJ: 12.5000 mg | SUBCUTANEOUS | 1 refills | Status: DC

## 2022-06-04 DIAGNOSIS — G4733 Obstructive sleep apnea (adult) (pediatric): Secondary | ICD-10-CM | POA: Diagnosis not present

## 2022-06-06 ENCOUNTER — Telehealth: Payer: Self-pay

## 2022-06-06 NOTE — Telephone Encounter (Signed)
Initiated Prior authorization JIR:[id number] 12.5MG /0.5ML pen-injectors Via: Covermymeds Case/Key: [id number] Status: approved as of 06/06/22 Reason:Coverage Start Date:05/07/2022;Coverage End Date:06/06/2023; Notified Pt via: Mychart

## 2022-06-10 ENCOUNTER — Other Ambulatory Visit: Payer: Self-pay | Admitting: Sports Medicine

## 2022-06-19 ENCOUNTER — Encounter: Payer: Self-pay | Admitting: Sports Medicine

## 2022-06-19 ENCOUNTER — Other Ambulatory Visit: Payer: Self-pay | Admitting: Sports Medicine

## 2022-06-19 DIAGNOSIS — E119 Type 2 diabetes mellitus without complications: Secondary | ICD-10-CM

## 2022-06-20 MED ORDER — ALLOPURINOL 300 MG PO TABS: 300.0000 mg | ORAL_TABLET | Freq: Two times a day (BID) | ORAL | 1 refills | Status: DC

## 2022-06-20 MED ORDER — XIGDUO XR 10-1000 MG PO TB24: 1.0000 | ORAL_TABLET | Freq: Every day | ORAL | 5 refills | Status: DC

## 2022-06-20 MED ORDER — DICLOFENAC SODIUM 75 MG PO TBEC: 75.0000 mg | DELAYED_RELEASE_TABLET | Freq: Two times a day (BID) | ORAL | 0 refills | Status: DC

## 2022-06-20 MED ORDER — MOUNJARO 12.5 MG/0.5ML ~~LOC~~ SOAJ: 12.5000 mg | SUBCUTANEOUS | 1 refills | Status: DC

## 2022-07-05 DIAGNOSIS — G4733 Obstructive sleep apnea (adult) (pediatric): Secondary | ICD-10-CM | POA: Diagnosis not present

## 2022-07-14 ENCOUNTER — Other Ambulatory Visit: Payer: Self-pay | Admitting: Sports Medicine

## 2022-07-21 ENCOUNTER — Encounter: Payer: Self-pay | Admitting: Sports Medicine

## 2022-07-23 ENCOUNTER — Other Ambulatory Visit: Payer: Self-pay

## 2022-07-23 DIAGNOSIS — G6289 Other specified polyneuropathies: Secondary | ICD-10-CM

## 2022-07-23 DIAGNOSIS — E119 Type 2 diabetes mellitus without complications: Secondary | ICD-10-CM

## 2022-07-23 MED ORDER — GABAPENTIN 300 MG PO CAPS: 300.0000 mg | ORAL_CAPSULE | Freq: Two times a day (BID) | ORAL | 3 refills | Status: DC

## 2022-07-23 MED ORDER — DAPAGLIFLOZIN PRO-METFORMIN ER 10-1000 MG PO TB24: 1.0000 | ORAL_TABLET | Freq: Every day | ORAL | 4 refills | Status: DC

## 2022-07-24 ENCOUNTER — Other Ambulatory Visit: Payer: Self-pay | Admitting: Sports Medicine

## 2022-07-24 DIAGNOSIS — E119 Type 2 diabetes mellitus without complications: Secondary | ICD-10-CM

## 2022-07-24 DIAGNOSIS — G6289 Other specified polyneuropathies: Secondary | ICD-10-CM

## 2022-07-24 MED ORDER — GABAPENTIN 300 MG PO CAPS: 300.0000 mg | ORAL_CAPSULE | Freq: Two times a day (BID) | ORAL | 3 refills | Status: DC

## 2022-07-24 MED ORDER — MOUNJARO 15 MG/0.5ML ~~LOC~~ SOAJ: 15.0000 mg | SUBCUTANEOUS | 11 refills | Status: DC

## 2022-07-24 MED ORDER — DAPAGLIFLOZIN PRO-METFORMIN ER 10-1000 MG PO TB24: 1.0000 | ORAL_TABLET | Freq: Every day | ORAL | 3 refills | Status: DC

## 2022-07-30 DIAGNOSIS — E119 Type 2 diabetes mellitus without complications: Secondary | ICD-10-CM | POA: Diagnosis not present

## 2022-07-30 DIAGNOSIS — J4520 Mild intermittent asthma, uncomplicated: Secondary | ICD-10-CM | POA: Diagnosis not present

## 2022-07-30 DIAGNOSIS — G4733 Obstructive sleep apnea (adult) (pediatric): Secondary | ICD-10-CM | POA: Diagnosis not present

## 2022-07-30 DIAGNOSIS — I480 Paroxysmal atrial fibrillation: Secondary | ICD-10-CM | POA: Diagnosis not present

## 2022-08-03 DIAGNOSIS — G4733 Obstructive sleep apnea (adult) (pediatric): Secondary | ICD-10-CM | POA: Diagnosis not present

## 2022-08-08 ENCOUNTER — Ambulatory Visit: Payer: BC Managed Care – PPO | Admitting: Family Medicine

## 2022-08-08 ENCOUNTER — Encounter: Payer: Self-pay | Admitting: Family Medicine

## 2022-08-08 VITALS — BP 134/88 | HR 63 | Temp 97.9°F | Ht 70.0 in | Wt 203.0 lb

## 2022-08-08 DIAGNOSIS — I10 Essential (primary) hypertension: Secondary | ICD-10-CM

## 2022-08-08 DIAGNOSIS — R600 Localized edema: Secondary | ICD-10-CM

## 2022-08-08 DIAGNOSIS — I480 Paroxysmal atrial fibrillation: Secondary | ICD-10-CM

## 2022-08-08 DIAGNOSIS — E119 Type 2 diabetes mellitus without complications: Secondary | ICD-10-CM

## 2022-08-08 DIAGNOSIS — G4733 Obstructive sleep apnea (adult) (pediatric): Secondary | ICD-10-CM | POA: Diagnosis not present

## 2022-08-08 LAB — POCT GLYCOSYLATED HEMOGLOBIN (HGB A1C): Hemoglobin A1C: 5.1 % (ref 4.0–5.6)

## 2022-08-08 MED ORDER — XIGDUO XR 5-500 MG PO TB24: 1.0000 | ORAL_TABLET | Freq: Every day | ORAL | 1 refills | Status: DC

## 2022-08-08 NOTE — Assessment & Plan Note (Signed)
He looks phenomenal today at 5.1.  He would like to get back on the Xigduo if possible.  He had some coverage issues previously and we will go ahead and send a new prescription to Xigduo to [facility] today but I am going to decrease the dose since his A1c looks absolutely fantastic.  It may require another authorization and the only thing that I can see on file is in regards to the [facility] which she is not going to take at this point.  Just encouraged him to keep up the good changes with exercise and diet.

## 2022-08-08 NOTE — Progress Notes (Signed)
Established Patient Office Visit  Subjective   Patient ID: ROBIE VITITOE, male    DOB: 03/24/1961  Age: 62 y.o. MRN: [id number]  Chief Complaint  Patient presents with   Edema    HPI  Bilat foot and ankle swelling x 1 month, comes and goes.  Feet feel stiff. Hx of DM. Does a lot of walking nd standing for his job.  He denies any chest pain or shortness of breath he does have a history of A-fib.  He does have a history of diabetes as well but has not been on Mounjaro.  He is also been out of his Merleen Nicely for about 2 to 3 months.  But in the last month he has noticed swelling in both feet.  He denies any pain he feels like this is different from his gout flare.  No major changes in his diet he says if anything he is actually eating more healthy and he has been exercising for about an hour 4-5 nights per week he is really trying to get more healthy he has been doing the treadmill and bike riding.  About a month ago he started taking his wife's metformin 500 mg twice a day.  Not sure if it was regular release or extended release but he did have some bowel issues for the first week that he started it but he stuck with it.   Lab Results  Component Value Date   HGBA1C 5.1 08/08/2022   Lab Results  Component Value Date   CREATININE 0.88 03/18/2022   CREATININE 1.14 11/05/2021   CREATININE 0.91 08/17/2020        ROS    Objective:     BP 134/88   Pulse 63   Temp 97.9 F (36.6 C) (Oral)   Ht '5\' 10"'$  (1.778 m)   Wt 203 lb (92.1 kg)   SpO2 100%   BMI 29.13 kg/m    Physical Exam   Results for orders placed or performed in visit on 08/08/22  POCT glycosylated hemoglobin (Hb A1C)  Result Value Ref Range   Hemoglobin A1C 5.1 4.0 - 5.6 %   HbA1c POC (<> result, manual entry)     HbA1c, POC (prediabetic range)     HbA1c, POC (controlled diabetic range)        The ASCVD Risk score (Arnett DK, et al., 2019) failed to calculate for the following reasons:   The valid total  cholesterol range is 130 to 320 mg/dL    Assessment & Plan:   Problem List Items Addressed This Visit       Cardiovascular and Mediastinum   Paroxysmal atrial fibrillation (HCC)   Relevant Orders   CBC   TSH   B Nat Peptide   COMPLETE METABOLIC PANEL WITH GFR   Urinalysis, Routine w reflex microscopic   Benign essential hypertension    BP ok today, goal, less than 130.         Endocrine   Controlled type 2 diabetes mellitus without complication, without long-term current use of insulin ([place]) - Primary    He looks phenomenal today at 5.1.  He would like to get back on the Xigduo if possible.  He had some coverage issues previously and we will go ahead and send a new prescription to Xigduo to [facility] today but I am going to decrease the dose since his A1c looks absolutely fantastic.  It may require another authorization and the only thing that I can see on file is  in regards to the [facility] which she is not going to take at this point.  Just encouraged him to keep up the good changes with exercise and diet.      Relevant Medications   Dapagliflozin Pro-metFORMIN ER (XIGDUO XR) 5-500 MG TB24   Other Relevant Orders   POCT glycosylated hemoglobin (Hb A1C) (Completed)   CBC   TSH   B Nat Peptide   COMPLETE METABOLIC PANEL WITH GFR   Urinalysis, Routine w reflex microscopic   Other Visit Diagnoses     Bilateral edema of lower extremity       Relevant Orders   CBC   TSH   B Nat Peptide   COMPLETE METABOLIC PANEL WITH GFR   Urinalysis, Routine w reflex microscopic       Bilateral lower extremity edema-unclear etiology.  He did come off the dapagliflozin which can act as a diuretic so that certainly a possibility he is not having a lot of pain or discomfort to indicate a gout flare.  No chest pain so cardiac heart failure is less likely.  Will check renal function, liver function, check for thyroid disorder and check a CBC.  Will also check a urinalysis to evaluate for protein  loss.  Will call with results once available.  No follow-ups on file.    Beatrice Lecher, MD

## 2022-08-08 NOTE — Assessment & Plan Note (Signed)
BP ok today, goal, less than 130.

## 2022-08-09 LAB — COMPLETE METABOLIC PANEL WITH GFR
AG Ratio: 2.4 (calc) (ref 1.0–2.5)
ALT: 35 U/L (ref 9–46)
AST: 30 U/L (ref 10–35)
Albumin: 4.6 g/dL (ref 3.6–5.1)
Alkaline phosphatase (APISO): 52 U/L (ref 35–144)
BUN: 22 mg/dL (ref 7–25)
CO2: 31 mmol/L (ref 20–32)
Calcium: 9.5 mg/dL (ref 8.6–10.3)
Chloride: 104 mmol/L (ref 98–110)
Creat: 0.93 mg/dL (ref 0.70–1.30)
Globulin: 1.9 g/dL (calc) (ref 1.9–3.7)
Glucose, Bld: 90 mg/dL (ref 65–99)
Potassium: 4.4 mmol/L (ref 3.5–5.3)
Sodium: 141 mmol/L (ref 135–146)
Total Bilirubin: 0.8 mg/dL (ref 0.2–1.2)
Total Protein: 6.5 g/dL (ref 6.1–8.1)
eGFR: 95 mL/min/{1.73_m2} (ref >=60)

## 2022-08-09 LAB — TSH: TSH: 4.02 mIU/L (ref 0.40–4.50)

## 2022-08-09 LAB — CBC
HCT: 44 % (ref 38.5–50.0)
Hemoglobin: 15.2 g/dL (ref 13.2–17.1)
MCH: 32.8 pg (ref 27.0–33.0)
MCHC: 34.5 g/dL (ref 32.0–36.0)
MCV: 94.8 fL (ref 80.0–100.0)
MPV: 10.2 fL (ref 7.5–12.5)
Platelets: 135 10*3/uL — ABNORMAL LOW (ref 140–400)
RBC: 4.64 10*6/uL (ref 4.20–5.80)
RDW: 12.6 % (ref 11.0–15.0)
WBC: 5.8 10*3/uL (ref 3.8–10.8)

## 2022-08-09 LAB — URINALYSIS, ROUTINE W REFLEX MICROSCOPIC
Bilirubin Urine: NEGATIVE
Glucose, UA: NEGATIVE
Hgb urine dipstick: NEGATIVE
Ketones, ur: NEGATIVE
Leukocytes,Ua: NEGATIVE
Nitrite: NEGATIVE
Protein, ur: NEGATIVE
Specific Gravity, Urine: 1.006 (ref 1.001–1.035)
pH: 6.5 (ref 5.0–8.0)

## 2022-08-09 LAB — BRAIN NATRIURETIC PEPTIDE: Brain Natriuretic Peptide: 16 pg/mL (ref <100)

## 2022-08-11 NOTE — Progress Notes (Signed)
Hi William Ortega, platelets are still low around 135,000.  Similar to what it has been for the last year.  Thyroid looks great.  No sign of heart failure.  Liver function looks great this time.  Urine test looks normal.  No sign of protein loss to explain your swelling.  Sure if the platelet issue has been addressed before or if Dr. Dianah Field is evaluating that but I would recommend that you follow-up with him for the specific issue.  May need evaluation of his liver.  Hopefully her swelling is improved.

## 2022-08-23 ENCOUNTER — Other Ambulatory Visit: Payer: Self-pay | Admitting: Sports Medicine

## 2022-08-23 DIAGNOSIS — N139 Obstructive and reflux uropathy, unspecified: Secondary | ICD-10-CM

## 2022-08-28 DIAGNOSIS — I872 Venous insufficiency (chronic) (peripheral): Secondary | ICD-10-CM | POA: Diagnosis not present

## 2022-08-28 DIAGNOSIS — E1142 Type 2 diabetes mellitus with diabetic polyneuropathy: Secondary | ICD-10-CM | POA: Diagnosis not present

## 2022-08-28 DIAGNOSIS — B353 Tinea pedis: Secondary | ICD-10-CM | POA: Diagnosis not present

## 2022-09-01 DIAGNOSIS — G4733 Obstructive sleep apnea (adult) (pediatric): Secondary | ICD-10-CM | POA: Diagnosis not present

## 2022-09-17 ENCOUNTER — Other Ambulatory Visit: Payer: Self-pay | Admitting: Sports Medicine

## 2022-09-21 ENCOUNTER — Other Ambulatory Visit: Payer: Self-pay | Admitting: Sports Medicine

## 2022-09-21 DIAGNOSIS — N139 Obstructive and reflux uropathy, unspecified: Secondary | ICD-10-CM

## 2022-10-02 DIAGNOSIS — G4733 Obstructive sleep apnea (adult) (pediatric): Secondary | ICD-10-CM | POA: Diagnosis not present

## 2022-10-03 ENCOUNTER — Ambulatory Visit: Payer: BC Managed Care – PPO | Admitting: Sports Medicine

## 2022-10-03 VITALS — BP 120/79 | HR 72 | Ht 70.0 in | Wt 211.0 lb

## 2022-10-03 DIAGNOSIS — M51369 Other intervertebral disc degeneration, lumbar region without mention of lumbar back pain or lower extremity pain: Secondary | ICD-10-CM

## 2022-10-03 DIAGNOSIS — E119 Type 2 diabetes mellitus without complications: Secondary | ICD-10-CM | POA: Diagnosis not present

## 2022-10-03 DIAGNOSIS — Z0000 Encounter for general adult medical examination without abnormal findings: Secondary | ICD-10-CM

## 2022-10-03 DIAGNOSIS — M5136 Other intervertebral disc degeneration, lumbar region: Secondary | ICD-10-CM

## 2022-10-03 DIAGNOSIS — E291 Testicular hypofunction: Secondary | ICD-10-CM

## 2022-10-03 DIAGNOSIS — R809 Proteinuria, unspecified: Secondary | ICD-10-CM

## 2022-10-03 DIAGNOSIS — E1129 Type 2 diabetes mellitus with other diabetic kidney complication: Secondary | ICD-10-CM

## 2022-10-03 DIAGNOSIS — Z1211 Encounter for screening for malignant neoplasm of colon: Secondary | ICD-10-CM

## 2022-10-03 LAB — POCT UA - MICROALBUMIN
Creatinine, POC: 50 mg/dL
Microalbumin Ur, POC: 30 mg/L

## 2022-10-03 MED ORDER — BD LUER-LOK SYRINGE 18G X 1-1/2" 3 ML MISC
99 refills | Status: AC
Start: 2022-10-03 — End: ?

## 2022-10-03 MED ORDER — BD DISP NEEDLE 23G X 1" MISC
8 refills | Status: AC
Start: 2022-10-03 — End: ?

## 2022-10-03 MED ORDER — TESTOSTERONE CYPIONATE 200 MG/ML IM SOLN: 200.0000 mg | INTRAMUSCULAR | 1 refills | Status: DC

## 2022-10-03 MED ORDER — PREDNISONE 50 MG PO TABS: ORAL_TABLET | ORAL | 0 refills | Status: DC

## 2022-10-03 MED ORDER — METFORMIN HCL 500 MG PO TABS: 500.0000 mg | ORAL_TABLET | Freq: Two times a day (BID) | ORAL | 3 refills | Status: DC

## 2022-10-03 MED ORDER — VALSARTAN 40 MG PO TABS
40.0000 mg | ORAL_TABLET | Freq: Every day | ORAL | 3 refills | Status: DC
Start: 2022-10-03 — End: 2023-08-05

## 2022-10-03 NOTE — Assessment & Plan Note (Signed)
Adding Cologuard, last done 2021 and normal.

## 2022-10-03 NOTE — Progress Notes (Addendum)
    Procedures performed today:    None.  Independent interpretation of notes and tests performed by another provider:   None.  Brief History, Exam, Impression, and Recommendations:    Lumbar degenerative disc disease Known lumbar degenerative disc disease, last epidural was at [facility] August of 2023, left L4-L5 transforaminal. This is about to wear off and he is losing insurance so we will try to get him a left L4-L5 transforaminal epidural with [facility] imaging ASAP. Also adding a burst of prednisone if he is not able to get the epidural.  Type 2 diabetes mellitus, controlled, with renal complications (HCC) He has type 2 diabetes, historically on Mounjaro, unable to get this, Xigduo prescribed, insurance did not cover it, he has been using his wife's metformin 500 twice daily. He has also been exercising several miles daily. A1c was 5.1% last month. As metformin twice daily may potentially be enough for him we will refill this so he can use his own, and bring him back in 2 months to recheck his A1c, if controlled we can treat him with simply metformin 500 twice daily. Elevated urine microalbumin creatinine ratio, adding valsartan low-dose.  Annual physical exam Adding Cologuard, last done 2021 and normal.    ____________________________________________ Ihor Austin. Benjamin Stain, M.D., ABFM., CAQSM., AME. Primary Care and Sports Medicine [facility]  Adjunct Professor of Family Medicine  [place] [employer] [facility] of Medicine  Restaurant manager, fast food

## 2022-10-03 NOTE — Assessment & Plan Note (Addendum)
Known lumbar degenerative disc disease, last epidural was at [facility] August of 2023, left L4-L5 transforaminal. This is about to wear off and he is losing insurance so we will try to get him a left L4-L5 transforaminal epidural with [facility] imaging ASAP. Also adding a burst of prednisone if he is not able to get the epidural.

## 2022-10-03 NOTE — Assessment & Plan Note (Addendum)
He has type 2 diabetes, historically on Mounjaro, unable to get this, Xigduo prescribed, insurance did not cover it, he has been using his wife's metformin 500 twice daily. He has also been exercising several miles daily. A1c was 5.1% last month. As metformin twice daily may potentially be enough for him we will refill this so he can use his own, and bring him back in 2 months to recheck his A1c, if controlled we can treat him with simply metformin 500 twice daily. Elevated urine microalbumin creatinine ratio, adding valsartan low-dose.

## 2022-10-09 DIAGNOSIS — M4316 Spondylolisthesis, lumbar region: Secondary | ICD-10-CM | POA: Diagnosis not present

## 2022-10-19 DIAGNOSIS — Z1211 Encounter for screening for malignant neoplasm of colon: Secondary | ICD-10-CM | POA: Diagnosis not present

## 2022-10-21 DIAGNOSIS — M5416 Radiculopathy, lumbar region: Secondary | ICD-10-CM | POA: Diagnosis not present

## 2022-10-28 LAB — COLOGUARD: COLOGUARD: NEGATIVE

## 2022-10-29 ENCOUNTER — Other Ambulatory Visit: Payer: Self-pay | Admitting: Sports Medicine

## 2022-10-29 DIAGNOSIS — N139 Obstructive and reflux uropathy, unspecified: Secondary | ICD-10-CM

## 2022-11-01 DIAGNOSIS — G4733 Obstructive sleep apnea (adult) (pediatric): Secondary | ICD-10-CM | POA: Diagnosis not present

## 2022-11-06 ENCOUNTER — Other Ambulatory Visit: Payer: Self-pay | Admitting: Sports Medicine

## 2022-11-08 DIAGNOSIS — G4733 Obstructive sleep apnea (adult) (pediatric): Secondary | ICD-10-CM | POA: Diagnosis not present

## 2022-11-24 ENCOUNTER — Other Ambulatory Visit: Payer: Self-pay | Admitting: Sports Medicine

## 2022-11-24 DIAGNOSIS — N139 Obstructive and reflux uropathy, unspecified: Secondary | ICD-10-CM

## 2022-12-02 DIAGNOSIS — G4733 Obstructive sleep apnea (adult) (pediatric): Secondary | ICD-10-CM | POA: Diagnosis not present

## 2022-12-05 ENCOUNTER — Encounter: Payer: Self-pay | Admitting: Sports Medicine

## 2022-12-05 ENCOUNTER — Ambulatory Visit: Payer: BC Managed Care – PPO | Admitting: Sports Medicine

## 2022-12-05 VITALS — BP 115/72 | HR 80 | Ht 70.0 in | Wt 214.0 lb

## 2022-12-05 DIAGNOSIS — Z7984 Long term (current) use of oral hypoglycemic drugs: Secondary | ICD-10-CM | POA: Diagnosis not present

## 2022-12-05 DIAGNOSIS — E1129 Type 2 diabetes mellitus with other diabetic kidney complication: Secondary | ICD-10-CM | POA: Diagnosis not present

## 2022-12-05 DIAGNOSIS — E119 Type 2 diabetes mellitus without complications: Secondary | ICD-10-CM | POA: Diagnosis not present

## 2022-12-05 DIAGNOSIS — R809 Proteinuria, unspecified: Secondary | ICD-10-CM | POA: Diagnosis not present

## 2022-12-05 LAB — POCT GLYCOSYLATED HEMOGLOBIN (HGB A1C): Hemoglobin A1C: 6.7 % — AB (ref 4.0–5.6)

## 2022-12-05 NOTE — Progress Notes (Signed)
    Procedures performed today:    None.  Independent interpretation of notes and tests performed by another provider:   None.  Brief History, Exam, Impression, and Recommendations:    Type 2 diabetes mellitus, controlled, with renal complications (HCC) William Ortega returns for his diabetes follow-up, historically on Mounjaro but was unable to get this recently, will we tried [place], unfortunately insurance did not cover it, he was using his wife's metformin 500 twice daily and his A1c was down to 5.1% in March. He was also exercising several miles daily. We continued metformin 500 twice a day, his A1c has risen to 6.7%. He did get some steroid injections, and potentially dietary indiscretions. I have asked him to work very hard on cutting out the carbohydrates in his diet and getting back to exercising. Will continue metformin 500 twice daily, he is up-to-date on all other screening measures for diabetes. Return to see me in 6 months for repeat A1c.  I spent 30 minutes of total time managing this patient today, this includes chart review, face to face, and non-face to face time.  ____________________________________________ William Ortega. Benjamin Stain, M.D., ABFM., CAQSM., AME. Primary Care and Sports Medicine [facility]  Adjunct Professor of Family Medicine  [place] [employer] [facility] of Medicine  Restaurant manager, fast food

## 2022-12-05 NOTE — Assessment & Plan Note (Signed)
William Ortega returns for his diabetes follow-up, historically on Mounjaro but was unable to get this recently, will we tried [place], unfortunately insurance did not cover it, he was using his wife's metformin 500 twice daily and his A1c was down to 5.1% in March. He was also exercising several miles daily. We continued metformin 500 twice a day, his A1c has risen to 6.7%. He did get some steroid injections, and potentially dietary indiscretions. I have asked him to work very hard on cutting out the carbohydrates in his diet and getting back to exercising. Will continue metformin 500 twice daily, he is up-to-date on all other screening measures for diabetes. Return to see me in 6 months for repeat A1c.

## 2022-12-23 ENCOUNTER — Encounter (INDEPENDENT_AMBULATORY_CARE_PROVIDER_SITE_OTHER): Payer: BC Managed Care – PPO | Admitting: Sports Medicine

## 2022-12-23 DIAGNOSIS — N139 Obstructive and reflux uropathy, unspecified: Secondary | ICD-10-CM

## 2022-12-23 NOTE — Telephone Encounter (Signed)
I spent 5 total minutes of online digital evaluation and management services in this patient-initiated request for online care. 

## 2023-01-01 DIAGNOSIS — G4733 Obstructive sleep apnea (adult) (pediatric): Secondary | ICD-10-CM | POA: Diagnosis not present

## 2023-01-07 ENCOUNTER — Encounter: Payer: Self-pay | Admitting: Sports Medicine

## 2023-01-07 ENCOUNTER — Ambulatory Visit: Payer: BC Managed Care – PPO | Admitting: Urology

## 2023-01-07 ENCOUNTER — Encounter: Payer: Self-pay | Admitting: Urology

## 2023-01-07 VITALS — BP 117/74 | HR 60 | Ht 70.0 in | Wt 210.0 lb

## 2023-01-07 DIAGNOSIS — N401 Enlarged prostate with lower urinary tract symptoms: Secondary | ICD-10-CM

## 2023-01-07 DIAGNOSIS — R600 Localized edema: Secondary | ICD-10-CM

## 2023-01-07 DIAGNOSIS — E291 Testicular hypofunction: Secondary | ICD-10-CM | POA: Diagnosis not present

## 2023-01-07 DIAGNOSIS — N139 Obstructive and reflux uropathy, unspecified: Secondary | ICD-10-CM

## 2023-01-07 MED ORDER — TAMSULOSIN HCL 0.4 MG PO CAPS
0.8000 mg | ORAL_CAPSULE | Freq: Every day | ORAL | 0 refills | Status: DC
Start: 2023-01-07 — End: 2023-02-23

## 2023-01-07 MED ORDER — FINASTERIDE 5 MG PO TABS: 5.0000 mg | ORAL_TABLET | Freq: Every day | ORAL | 3 refills | Status: DC

## 2023-01-07 NOTE — Progress Notes (Signed)
Assessment: 1. Benign prostatic hyperplasia with lower urinary tract symptoms, symptom details unspecified   2. Hypogonadism in male      Plan: Today had a long and detailed discussion with the patient regarding his urologic issues.  Considering his IM Depo testosterone usage I told him my concerns regarding supraphysiologic levels which can exacerbate his BPH as well as sleep apnea as well as lead to other potential risk and side effects as well.  I discussed with him the option of changing to a transdermal preparation which could provide more stable appropriate levels.  He would like to try this.  Rx: 20% bioidentical testosterone cream; 1 mL daily-[facility]  In terms of his lower urinary tract symptoms we discussed additional medical management. Will double up tamsulosin to 0.8 mg daily. Will begin additional medical therapy with finasteride 5 mg daily. Nature of medication including proper utilization as well as potential adverse events and side effects reviewed.  Patient will follow-up in 6 weeks for repeat testosterone level Follow-up 4 months for recheck.  Chief Complaint: LUTS  History of Present Illness:  William Ortega is a 62 y.o. male with PMHx of DM2, OSA, Afib, and HTN who is seen in consultation from Monica Becton, MD for evaluation of a number of urologic issues. [name] is remarkable for evaluation by Dr. Darvin Neighbours force voiding issues as well as low total testosterone several years ago.  At 1 point he was found to have an abnormal cytology and positive FISH.  He underwent cystoscopy under anesthesia with retrogrades and renal pelvic washings that all turned out negative.  His subsequent follow-up cytology was negative.  The patient was also started on AndroGel in 2019 but apparently did not get adequate levels and was subsequently changed over to IM Depo testosterone which she has been on for the last  several years.  He has had well-documented hypogonadism with normal pituitary hormones.  He does report progressive worsening lower urinary tract symptoms despite being on tamsulosin over the last few years. Current IPSS = 17  PVR today = 100 mL  Urinalysis negative  DRE reveals a large approximately 70 g prostate without evidence of nodules or induration  PSA DATA: 11/2017  1.0 07/2019  1.6 10/2021  2.6 (31% free)  Past Medical History:  Past Medical History:  Diagnosis Date   Allergy    Arthritis    Atrial fibrillation (HCC)    Bulging lumbar disc    Diabetes mellitus without complication (HCC)    Hypertension    OSA (obstructive sleep apnea)    Pneumonia     Past Surgical History:  Past Surgical History:  Procedure Laterality Date   KNEE ARTHROPLASTY     pt reports just arthroscopy   NASAL SEPTUM SURGERY     UMBILICAL HERNIA REPAIR N/A 03/20/2022   Procedure: LAPAROSCOPIC ASSISTED REPAIR UMBILICAL HERNIA WITH MESH;  Surgeon: Gaynelle Adu, MD;  Location: [facility];  Service: General;  Laterality: N/A;   VASECTOMY      Allergies:  Allergies  Allergen Reactions   Aluminum Rash   Nickel Rash   Penicillins Rash    Family History:  Family History  Problem Relation Age of Onset   Heart disease Mother    Stroke Paternal Grandmother     Social History:  Social History   Tobacco Use   Smoking status: Never   Smokeless tobacco: Never  Vaping Use   Vaping status: Never Used  Substance Use Topics   Alcohol use: Yes    Comment: occ   Drug use: No    Review of symptoms:  Constitutional:  Negative for unexplained weight loss, night sweats, fever, chills ENT:  Negative for nose bleeds, sinus pain, painful swallowing CV:  Negative for chest pain, shortness of breath, exercise intolerance, palpitations, loss of consciousness Resp:  Negative for cough, wheezing, shortness of breath GI:  Negative for nausea, vomiting, diarrhea, bloody stools GU:  Positives noted in  HPI; otherwise negative for gross hematuria, dysuria, urinary incontinence Neuro:  Negative for seizures, poor balance, limb weakness, slurred speech Psych:  Negative for lack of energy, depression, anxiety Endocrine:  Negative for polydipsia, polyuria, symptoms of hypoglycemia (dizziness, hunger, sweating) Hematologic:  Negative for anemia, purpura, petechia, prolonged or excessive bleeding, use of anticoagulants  Allergic:  Negative for difficulty breathing or choking as a result of exposure to anything; no shellfish allergy; no allergic response (rash/itch) to materials, foods  Physical exam: BP 117/74   Pulse 60   Ht 5\' 10"  (1.778 m)   Wt 210 lb (95.3 kg)   BMI 30.13 kg/m  GENERAL APPEARANCE:  Well appearing, well developed, well nourished, NAD  GU: Normal external genitalia DRE: Normal sphincter tone; prostate is large approximately 70 g without evidence of nodules or induration  RESULTS:  UA neg

## 2023-01-12 ENCOUNTER — Ambulatory Visit: Payer: BC Managed Care – PPO | Admitting: Internal Medicine

## 2023-01-12 ENCOUNTER — Ambulatory Visit: Payer: BC Managed Care – PPO | Attending: Internal Medicine | Admitting: Internal Medicine

## 2023-01-12 ENCOUNTER — Encounter: Payer: Self-pay | Admitting: Internal Medicine

## 2023-01-12 VITALS — BP 124/84 | HR 55 | Ht 70.0 in | Wt 215.6 lb

## 2023-01-12 DIAGNOSIS — R600 Localized edema: Secondary | ICD-10-CM

## 2023-01-12 DIAGNOSIS — G4733 Obstructive sleep apnea (adult) (pediatric): Secondary | ICD-10-CM

## 2023-01-12 DIAGNOSIS — R809 Proteinuria, unspecified: Secondary | ICD-10-CM

## 2023-01-12 DIAGNOSIS — I480 Paroxysmal atrial fibrillation: Secondary | ICD-10-CM | POA: Diagnosis not present

## 2023-01-12 DIAGNOSIS — I10 Essential (primary) hypertension: Secondary | ICD-10-CM | POA: Diagnosis not present

## 2023-01-12 DIAGNOSIS — E1129 Type 2 diabetes mellitus with other diabetic kidney complication: Secondary | ICD-10-CM

## 2023-01-12 DIAGNOSIS — Z79899 Other long term (current) drug therapy: Secondary | ICD-10-CM

## 2023-01-12 DIAGNOSIS — M79669 Pain in unspecified lower leg: Secondary | ICD-10-CM

## 2023-01-12 DIAGNOSIS — I8393 Asymptomatic varicose veins of bilateral lower extremities: Secondary | ICD-10-CM

## 2023-01-12 MED ORDER — FLECAINIDE ACETATE 50 MG PO TABS: 50.0000 mg | ORAL_TABLET | Freq: Two times a day (BID) | ORAL | 2 refills | Status: DC

## 2023-01-12 MED ORDER — METOPROLOL SUCCINATE ER 25 MG PO TB24: 25.0000 mg | ORAL_TABLET | Freq: Every day | ORAL | 2 refills | Status: DC

## 2023-01-12 NOTE — Progress Notes (Unsigned)
Cardiology Office Note:    Date:  01/12/2023   ID:  REGGY DRYER, DOB 01/18/1961, MRN [id number]  PCP:  Monica Becton, MD  Cardiologist:  None  Electrophysiologist:  None   Referring MD: Monica Becton   Chief Complaint/Reason for Referral: LE swelling  History of Present Illness:    William Ortega is a 62 y.o. male with a history of PAF on flecanide, OSA on cpap, HLD, HTN, DM who presents for LE swelling.  Here with wife Delaney Meigs. Worse swelling when standing on concrete at work. Legs swell with prolonged sitting. He is a Copy.  Se[place] cardiology at [place], Dr. Sampson Goon. [place] echo 01/10/22: SUMMARY  No signs suggesting chronically elevated filling pressures.  There is mild concentric left ventricular hypertrophy with normal wall  motion, normal systolic function and ejection fraction  60-65% .  Left ventricular filling pattern is prolonged relaxation. Left atrial  pressure is indeterminate due to  E-e  > 8 and < 14  The right ventricle is normal in size and function.  The atria are normal in size.  There is no significant valvular stenosis or regurgitation.  There was insufficient TR detected to calculate RV systolic pressure.  Normal IVC size and collapsibility; Right atrial pressure is estimated  to be 3 mm Hg.  Stress echo 01/10/22: SUMMARY  The patient had no chest pain. The patient achieved 93 % of maximum  predicted heart rate. The METS achieved was 10.10. Exercise capacity  was average.   Negative exercise echocardiography for inducible ischemia at target  heart rate. Single segment in mid anteroseptal hypokinesis after  stress of unclear significance. [2 contiguous segments of WM  abnormality are required to consider a stress echo positive]. No  diagnostic ST segment changes were seen. This is a low risk stress  test.  EKG 01/10/22 report: NSR  Past Medical History:  Diagnosis Date   Allergy    Arthritis    Atrial  fibrillation (HCC)    Bulging lumbar disc    Diabetes mellitus without complication (HCC)    Hypertension    OSA (obstructive sleep apnea)    Pneumonia     Past Surgical History:  Procedure Laterality Date   KNEE ARTHROPLASTY     pt reports just arthroscopy   NASAL SEPTUM SURGERY     UMBILICAL HERNIA REPAIR N/A 03/20/2022   Procedure: LAPAROSCOPIC ASSISTED REPAIR UMBILICAL HERNIA WITH MESH;  Surgeon: Gaynelle Adu, MD;  Location: [facility];  Service: General;  Laterality: N/A;   VASECTOMY      Current Medications: Current Meds  Medication Sig   allopurinol (ZYLOPRIM) 300 MG tablet TAKE 1 TABLET BY MOUTH 2 TIMES DAILY.   AMBULATORY NON FORMULARY MEDICATION Knee-high, medium compression, graduated compression stockings. Apply to lower extremities.   aspirin EC 81 MG tablet Take 1 tablet (81 mg total) by mouth daily.   B-D 3CC LUER-LOK SYR 23GX1" 23G X 1" 3 ML MISC USE AS DIRECTED   Calcium Citrate (CITRACAL PO) Take 1 tablet by mouth daily.   diclofenac (VOLTAREN) 75 MG EC tablet TAKE 1 TABLET BY MOUTH TWICE A DAY   finasteride (PROSCAR) 5 MG tablet Take 1 tablet (5 mg total) by mouth daily.   flecainide (TAMBOCOR) 50 MG tablet Take 50 mg by mouth 2 (two) times daily.   gabapentin (NEURONTIN) 300 MG capsule Take 1 capsule (300 mg total) by mouth 2 (two) times daily.   ipratropium (ATROVENT) 0.03 % nasal spray  Place 1 spray into both nostrils 4 (four) times daily. (Patient taking differently: Place 1 spray into both nostrils daily as needed for rhinitis.)   loratadine (CLARITIN) 10 MG tablet Take 10 mg by mouth daily.   metFORMIN (GLUCOPHAGE) 500 MG tablet Take 1 tablet (500 mg total) by mouth 2 (two) times daily with a meal.   metoprolol succinate (TOPROL-XL) 25 MG 24 hr tablet Take 1 tablet (25 mg total) by mouth daily.   Multiple Vitamins-Minerals (CENTRUM SILVER ADULT 50+) TABS Take 1 tablet by mouth daily.   NEEDLE, DISP, 23 G (BD DISP NEEDLE) 23G X 1" MISC Inject testosterone every  14 days.   rosuvastatin (CRESTOR) 10 MG tablet TAKE 1 TABLET BY MOUTH EVERY DAY   SYRINGE-NEEDLE, DISP, 3 ML (B-D 3CC LUER-LOK SYR 18GX1-1/2) 18G X 1-1/2" 3 ML MISC Inject testosterone every 14 days.   tamsulosin (FLOMAX) 0.4 MG CAPS capsule Take 2 capsules (0.8 mg total) by mouth daily after supper.   valsartan (DIOVAN) 40 MG tablet Take 1 tablet (40 mg total) by mouth daily.     Allergies:   Aluminum, Nickel, and Penicillins   Social History   Tobacco Use   Smoking status: Never   Smokeless tobacco: Never  Vaping Use   Vaping status: Never Used  Substance Use Topics   Alcohol use: Yes    Comment: occ   Drug use: No     Family History: The patient's family history includes Heart disease in his mother; Stroke in his paternal grandmother.  ROS:   Please see the history of present illness.    All other systems reviewed and are negative.  EKGs/Labs/Other Studies Reviewed:    The following studies were reviewed today:  EKG:  *** EKG Interpretation Date/Time:  Monday January 12 2023 13:45:56 EDT Ventricular Rate:  55 PR Interval:  158 QRS Duration:  92 QT Interval:  426 QTC Calculation: 407 R Axis:   37  Text Interpretation: Sinus bradycardia When compared with ECG of 06-Jun-2014 14:28, Sinus rhythm has replaced Atrial fibrillation Vent. rate has decreased BY  37 BPM Confirmed by Weston Brass ([id number]) on 01/12/2023 2:04:25 PM   Imaging studies that I have independently reviewed today: ***  Recent Labs: 08/08/2022: ALT 35; Brain Natriuretic Peptide 16; BUN 22; Creat 0.93; Hemoglobin 15.2; Platelets 135; Potassium 4.4; Sodium 141; TSH 4.02  Recent Lipid Panel    Component Value Date/Time   CHOL 115 11/05/2021 0000   TRIG 201 (H) 11/05/2021 0000   HDL 31 (L) 11/05/2021 0000   CHOLHDL 3.7 11/05/2021 0000   VLDL 17 03/07/2016 0932   LDLCALC 56 11/05/2021 0000    Physical Exam:    VS:  BP 124/84   Pulse (!) 55   Ht 5\' 10"  (1.778 m)   Wt 215 lb 9.6 oz (97.8 kg)    SpO2 98%   BMI 30.94 kg/m     Wt Readings from Last 5 Encounters:  01/12/23 215 lb 9.6 oz (97.8 kg)  01/07/23 210 lb (95.3 kg)  12/05/22 214 lb (97.1 kg)  10/03/22 211 lb (95.7 kg)  08/08/22 203 lb (92.1 kg)    Constitutional: No acute distress Eyes: sclera non-icteric, normal conjunctiva and lids ENMT: normal dentition, moist mucous membranes Cardiovascular: regular rhythm, normal rate, no murmur. S1 and S2 normal. No jugular venous distention.  Respiratory: clear to auscultation bilaterally GI : normal bowel sounds, soft and nontender. No distention.   MSK: extremities warm, well perfused. No edema.  NEURO: grossly nonfocal  exam, moves all extremities. PSYCH: alert and oriented x 3, normal mood and affect.   ASSESSMENT:    1. Lower extremity edema   2. Paroxysmal atrial fibrillation (HCC)   3. Benign essential hypertension   4. OSA on CPAP   5. Controlled type 2 diabetes mellitus with microalbuminuria, without long-term current use of insulin (HCC)   6. Long term current use of antiarrhythmic drug    PLAN:    Lower extremity edema - Plan: EKG 12-Lead  Paroxysmal atrial fibrillation (HCC) - Plan: EKG 12-Lead  Benign essential hypertension - Plan: EKG 12-Lead  OSA on CPAP - Plan: EKG 12-Lead  Controlled type 2 diabetes mellitus with microalbuminuria, without long-term current use of insulin (HCC) - Plan: EKG 12-Lead  Long term current use of antiarrhythmic drug - Plan: EKG 12-Lead  Total time of encounter: *** minutes total time of encounter, including *** minutes spent in face-to-face patient care on the date of this encounter. This time includes coordination of care and counseling regarding above mentioned problem list. Remainder of non-face-to-face time involved reviewing chart documents/testing relevant to the patient encounter and documentation in the medical record. I have independently reviewed documentation from referring provider.   Weston Brass, MD,  [facility]  [facility]   Shared Decision Making/Informed Consent:   {Are you ordering a CV Procedure (e.g. stress test, cath, DCCV, TEE, etc)?   Press F2        :[id number]}   Medication Adjustments/Labs and Tests Ordered: Current medicines are reviewed at length with the patient today.  Concerns regarding medicines are outlined above.   Orders Placed This Encounter  Procedures   EKG 12-Lead    No orders of the defined types were placed in this encounter.   There are no Patient Instructions on file for this visit.

## 2023-01-12 NOTE — Patient Instructions (Signed)
Medication Instructions:  Your physician recommends that you continue on your current medications as directed. Please refer to the Current Medication list given to you today.  *If you need a refill on your cardiac medications before your next appointment, please call your pharmacy*   Testing/Procedures: Your physician has requested that you have a lower extremity venous duplex. This test is an ultrasound of the veins in the legs. It looks at venous blood flow that carries blood from the heart to the legs. Allow one hour for a Lower Venous exam. There are no restrictions or special instructions. This will take place at [address] [facility]  Your physician has requested that you have a lower extremity arterial duplex. During this test, ultrasound is used to evaluate arterial blood flow in the legs. Allow one hour for this exam. There are no restrictions or special instructions. This will take place at [address] [facility], [address].  Your physician has requested that you have an ankle brachial index (ABI). During this test an ultrasound and blood pressure cuff are used to evaluate the arteries that supply the arms and legs with blood. Allow thirty minutes for this exam. There are no restrictions or special instructions. This will take place at [address] [facility], [address].      Follow-Up: At [facility], you and your health needs are our priority.  As part of our continuing mission to provide you with exceptional heart care, we have created designated Provider Care Teams.  These Care Teams include your primary Cardiologist (physician) and Advanced Practice Providers (APPs -  Physician Assistants and Nurse Practitioners) who all work together to provide you with the care you need, when you need it.  We recommend signing up for the patient portal called "MyChart".  Sign up information is provided on this After Visit Summary.  MyChart is used to connect with patients for Virtual  Visits (Telemedicine).  Patients are able to view lab/test results, encounter notes, upcoming appointments, etc.  Non-urgent messages can be sent to your provider as well.   To learn more about what you can do with MyChart, go to [email or web address].    Your next appointment:   6 month(s)  Provider:   Dr. Jacques Navy

## 2023-01-14 ENCOUNTER — Other Ambulatory Visit: Payer: Self-pay | Admitting: Internal Medicine

## 2023-01-14 DIAGNOSIS — G4733 Obstructive sleep apnea (adult) (pediatric): Secondary | ICD-10-CM

## 2023-01-14 DIAGNOSIS — E1129 Type 2 diabetes mellitus with other diabetic kidney complication: Secondary | ICD-10-CM

## 2023-01-14 DIAGNOSIS — I10 Essential (primary) hypertension: Secondary | ICD-10-CM

## 2023-01-14 DIAGNOSIS — R600 Localized edema: Secondary | ICD-10-CM

## 2023-01-14 DIAGNOSIS — I480 Paroxysmal atrial fibrillation: Secondary | ICD-10-CM

## 2023-01-14 DIAGNOSIS — Z79899 Other long term (current) drug therapy: Secondary | ICD-10-CM

## 2023-01-14 DIAGNOSIS — M79669 Pain in unspecified lower leg: Secondary | ICD-10-CM

## 2023-01-15 ENCOUNTER — Telehealth: Payer: Self-pay | Admitting: Internal Medicine

## 2023-01-15 ENCOUNTER — Telehealth: Payer: Self-pay

## 2023-01-15 NOTE — Telephone Encounter (Signed)
Left detailed message for pt regarding recommendations for compression stocks per Dr. Jacques Navy. Advised pt that we can leave information at the front desk for him. 15-44mmHg recommended for pt per Dr. Jacques Navy. Office number left for pt to return call.

## 2023-01-15 NOTE — Telephone Encounter (Signed)
Patient stated the information for his compression socks can be left in the office and he will collect it tomorrow.

## 2023-01-16 ENCOUNTER — Ambulatory Visit (HOSPITAL_COMMUNITY): Admission: RE | Admit: 2023-01-16 | Payer: BC Managed Care – PPO | Source: Ambulatory Visit

## 2023-01-16 DIAGNOSIS — M79669 Pain in unspecified lower leg: Secondary | ICD-10-CM | POA: Insufficient documentation

## 2023-01-16 DIAGNOSIS — R600 Localized edema: Secondary | ICD-10-CM | POA: Insufficient documentation

## 2023-01-26 ENCOUNTER — Ambulatory Visit (HOSPITAL_COMMUNITY)
Admission: RE | Admit: 2023-01-26 | Discharge: 2023-01-26 | Disposition: A | Discharge status: Home / Self Care | Payer: BC Managed Care – PPO | Source: Ambulatory Visit | Attending: Internal Medicine | Admitting: Internal Medicine

## 2023-01-26 ENCOUNTER — Other Ambulatory Visit: Payer: Self-pay | Admitting: Internal Medicine

## 2023-01-26 ENCOUNTER — Encounter (HOSPITAL_COMMUNITY): Payer: BC Managed Care – PPO

## 2023-01-26 ENCOUNTER — Inpatient Hospital Stay (HOSPITAL_COMMUNITY): Admission: RE | Admit: 2023-01-26 | Payer: BC Managed Care – PPO | Source: Ambulatory Visit

## 2023-01-26 DIAGNOSIS — M79669 Pain in unspecified lower leg: Secondary | ICD-10-CM | POA: Diagnosis not present

## 2023-01-26 DIAGNOSIS — R600 Localized edema: Secondary | ICD-10-CM | POA: Diagnosis not present

## 2023-01-26 DIAGNOSIS — E1129 Type 2 diabetes mellitus with other diabetic kidney complication: Secondary | ICD-10-CM

## 2023-01-26 DIAGNOSIS — I10 Essential (primary) hypertension: Secondary | ICD-10-CM

## 2023-01-26 DIAGNOSIS — R202 Paresthesia of skin: Secondary | ICD-10-CM | POA: Diagnosis not present

## 2023-01-26 DIAGNOSIS — I8393 Asymptomatic varicose veins of bilateral lower extremities: Secondary | ICD-10-CM

## 2023-01-26 DIAGNOSIS — R200 Anesthesia of skin: Secondary | ICD-10-CM

## 2023-01-26 DIAGNOSIS — R809 Proteinuria, unspecified: Secondary | ICD-10-CM | POA: Diagnosis not present

## 2023-01-26 DIAGNOSIS — I480 Paroxysmal atrial fibrillation: Secondary | ICD-10-CM

## 2023-01-26 DIAGNOSIS — G4733 Obstructive sleep apnea (adult) (pediatric): Secondary | ICD-10-CM

## 2023-01-26 DIAGNOSIS — Z79899 Other long term (current) drug therapy: Secondary | ICD-10-CM

## 2023-01-26 LAB — VAS US LOWER EXT ART SEG MULTI (SEGMENTALS & LE RAYNAUDS)
Left ABI: 1.28
Right ABI: 1.29

## 2023-01-30 DIAGNOSIS — G4733 Obstructive sleep apnea (adult) (pediatric): Secondary | ICD-10-CM | POA: Diagnosis not present

## 2023-02-10 ENCOUNTER — Other Ambulatory Visit: Payer: Self-pay

## 2023-02-10 ENCOUNTER — Other Ambulatory Visit: Payer: BC Managed Care – PPO

## 2023-02-10 DIAGNOSIS — E291 Testicular hypofunction: Secondary | ICD-10-CM

## 2023-02-11 ENCOUNTER — Other Ambulatory Visit: Payer: BC Managed Care – PPO

## 2023-02-11 LAB — TESTOSTERONE: Testosterone: 534 ng/dL (ref 264–916)

## 2023-02-22 ENCOUNTER — Other Ambulatory Visit: Payer: Self-pay | Admitting: Sports Medicine

## 2023-02-22 ENCOUNTER — Other Ambulatory Visit: Payer: Self-pay | Admitting: Urology

## 2023-02-22 DIAGNOSIS — N139 Obstructive and reflux uropathy, unspecified: Secondary | ICD-10-CM

## 2023-02-26 ENCOUNTER — Encounter: Payer: Self-pay | Admitting: Physician Assistant

## 2023-02-26 ENCOUNTER — Ambulatory Visit: Payer: BC Managed Care – PPO | Admitting: Physician Assistant

## 2023-02-26 VITALS — BP 115/75 | HR 59 | Temp 98.0°F | Wt 217.0 lb

## 2023-02-26 DIAGNOSIS — I83813 Varicose veins of bilateral lower extremities with pain: Secondary | ICD-10-CM | POA: Diagnosis not present

## 2023-02-26 DIAGNOSIS — M7989 Other specified soft tissue disorders: Secondary | ICD-10-CM

## 2023-02-26 NOTE — Progress Notes (Signed)
[facility]           OF   History and Physical   William Ortega is a 62 y.o. male who presents with BLE varicose veins and swelling.    He was seen by Dr. Jacques Navy in early August for leg swelling.  He has hx of varicose veins.  He was referred for further workup.    He states that he has swelling in both legs.  He stands on concrete throughout the day.  His swelling is better in the mornings.  He states if he is sitting in his recliner at night, his legs will start throbbing and getting restless and at that point, he just goes to bed and it gets better.  He has never had blood clots.  He has not had any procedures on his legs.  He has had the toenail on the right great toe removed. He does not have any skin color changes.  He does have some superficial veins around his ankle that he states has bled in the past.  He does wear knee high compression and this does help his sx.  He states that if he wears socks to mid calf, he will have significant swelling above the sock.   He does have family hx with his mother. He states he was also wanting to get evaluated as he has a family member that developed gangrene and had to have amputation.   He does not have any non healing wounds.  He states he does have some neuropathy and will get a stinging feeling in his lower legs at times.   He has hx of PAF on Flecainide, DM with neuropathy, hyperlipidemia.   The pt is on a statin for cholesterol management.  The pt is on a daily aspirin.   Other AC:  none The pt is on ARB, BB for hypertension.   The pt is  on medication for diabetes.   Tobacco hx:  never  Pt does not have family hx of AAA.  Past Medical History:  Diagnosis Date   Allergy    Arthritis    Atrial fibrillation (HCC)    Bulging lumbar disc    Diabetes mellitus without complication (HCC)    Hypertension    OSA (obstructive sleep apnea)    Pneumonia     Past Surgical History:  Procedure Laterality  Date   KNEE ARTHROPLASTY     pt reports just arthroscopy   NASAL SEPTUM SURGERY     UMBILICAL HERNIA REPAIR N/A 03/20/2022   Procedure: LAPAROSCOPIC ASSISTED REPAIR UMBILICAL HERNIA WITH MESH;  Surgeon: Gaynelle Adu, MD;  Location: [facility];  Service: General;  Laterality: N/A;   VASECTOMY      Social History   Socioeconomic History   Marital status: Married    Spouse name: Not on file   Number of children: Not on file   Years of education: Not on file   Highest education level: Associate degree: occupational, Scientist, product/process development, or vocational program  Occupational History   Not on file  Tobacco Use   Smoking status: Never   Smokeless tobacco: Never  Vaping Use   Vaping status: Never Used  Substance and Sexual Activity   Alcohol use: Yes    Comment: occ   Drug use: No   Sexual activity: Yes  Other Topics Concern   Not on file  Social History Narrative   Not on file   Social Determinants  of Health   Financial Resource Strain: Low Risk  (10/03/2022)   Overall Financial Resource Strain (CARDIA)    Difficulty of Paying Living Expenses: Not hard at all  Food Insecurity: No Food Insecurity (10/03/2022)   Hunger Vital Sign    Worried About Running Out of Food in the Last Year: Never true    Ran Out of Food in the Last Year: Never true  Transportation Needs: Unknown (10/03/2022)   PRAPARE - Administrator, Civil Service (Medical): No    Lack of Transportation (Non-Medical): Not on file  Physical Activity: Sufficiently Active (10/03/2022)   Exercise Vital Sign    Days of Exercise per Week: 7 days    Minutes of Exercise per Session: 90 min  Stress: No Stress Concern Present (10/03/2022)   [employer] of Occupational Health - Occupational Stress Questionnaire    Feeling of Stress : Not at all  Social Connections: Moderately Integrated (10/03/2022)   Social Connection and Isolation Panel [NHANES]    Frequency of Communication with Friends and Family: More than three times  a week    Frequency of Social Gatherings with Friends and Family: Once a week    Attends Religious Services: 1 to 4 times per year    Active Member of [employer] or Organizations: No    Attends Engineer, structural: Not on file    Marital Status: Married  Intimate Partner Violence: Unknown (09/10/2021)   Received from [employer], [facility]   HITS    Physically Hurt: Not on file    Insult or Talk Down To: Not on file    Threaten Physical Harm: Not on file    Scream or Curse: Not on file     Family History  Problem Relation Age of Onset   Heart disease Mother    Stroke Paternal Grandmother     Current Outpatient Medications  Medication Sig Dispense Refill   allopurinol (ZYLOPRIM) 300 MG tablet TAKE 1 TABLET BY MOUTH TWICE A DAY 180 tablet 1   AMBULATORY NON FORMULARY MEDICATION Knee-high, medium compression, graduated compression stockings. Apply to lower extremities. 1 each 0   aspirin EC 81 MG tablet Take 1 tablet (81 mg total) by mouth daily. 90 tablet 3   B-D 3CC LUER-LOK SYR 23GX1" 23G X 1" 3 ML MISC USE AS DIRECTED 12 each 8   Calcium Citrate (CITRACAL PO) Take 1 tablet by mouth daily.     diclofenac (VOLTAREN) 75 MG EC tablet TAKE 1 TABLET BY MOUTH TWICE A DAY 180 tablet 0   finasteride (PROSCAR) 5 MG tablet Take 1 tablet (5 mg total) by mouth daily. 90 tablet 3   flecainide (TAMBOCOR) 50 MG tablet Take 1 tablet (50 mg total) by mouth 2 (two) times daily. 180 tablet 2   gabapentin (NEURONTIN) 300 MG capsule Take 1 capsule (300 mg total) by mouth 2 (two) times daily. 180 capsule 3   ipratropium (ATROVENT) 0.03 % nasal spray Place 1 spray into both nostrils 4 (four) times daily. (Patient taking differently: Place 1 spray into both nostrils daily as needed for rhinitis.) 30 mL 1   loratadine (CLARITIN) 10 MG tablet Take 10 mg by mouth daily.     metFORMIN (GLUCOPHAGE) 500 MG tablet Take 1 tablet (500 mg total) by mouth 2 (two) times daily with a meal. 180 tablet 3    metoprolol succinate (TOPROL-XL) 25 MG 24 hr tablet Take 1 tablet (25 mg total) by mouth daily. 90 tablet 2  Multiple Vitamins-Minerals (CENTRUM SILVER ADULT 50+) TABS Take 1 tablet by mouth daily.     NEEDLE, DISP, 23 G (BD DISP NEEDLE) 23G X 1" MISC Inject testosterone every 14 days. 12 each 8   predniSONE (DELTASONE) 50 MG tablet One tab PO daily for 5 days. (Patient not taking: Reported on 01/12/2023) 5 tablet 0   rosuvastatin (CRESTOR) 10 MG tablet TAKE 1 TABLET BY MOUTH EVERY DAY 90 tablet 3   SYRINGE-NEEDLE, DISP, 3 ML (B-D 3CC LUER-LOK SYR 18GX1-1/2) 18G X 1-1/2" 3 ML MISC Inject testosterone every 14 days. 100 each PRN   tamsulosin (FLOMAX) 0.4 MG CAPS capsule TAKE 2 CAPSULES (0.8 MG TOTAL) BY MOUTH DAILY AFTER SUPPER. 180 capsule 0   valsartan (DIOVAN) 40 MG tablet Take 1 tablet (40 mg total) by mouth daily. 90 tablet 3   No current facility-administered medications for this visit.    Allergies  Allergen Reactions   Aluminum Rash   Nickel Rash   Penicillins Rash    REVIEW OF SYSTEMS:   [X]  denotes positive finding, [ ]  denotes negative finding Cardiac  Comments:  Chest pain or chest pressure:    Shortness of breath upon exertion:    Short of breath when lying flat:    Irregular heart rhythm:        Vascular    Pain in calf, thigh, or hip brought on by ambulation:    Pain in feet at night that wakes you up from your sleep:     Blood clot in your veins:    Leg swelling:  x       Pulmonary    Oxygen at home:    Productive cough:     Wheezing:         Neurologic    Sudden weakness in arms or legs:     Sudden numbness in arms or legs:     Sudden onset of difficulty speaking or slurred speech:    Temporary loss of vision in one eye:     Problems with dizziness:         Gastrointestinal    Blood in stool:     Vomited blood:         Genitourinary    Burning when urinating:     Blood in urine:        Psychiatric    Major depression:         Hematologic     Bleeding problems:    Problems with blood clotting too easily:        Skin    Rashes or ulcers:        Constitutional    Fever or chills:      PHYSICAL EXAMINATION:  Today's Vitals   02/26/23 1454  BP: 115/75  Pulse: (!) 59  Temp: 98 F (36.7 C)  TempSrc: Temporal  SpO2: 96%  Weight: 217 lb (98.4 kg)   Body mass index is 31.14 kg/m.   General:  WDWN in NAD; vital signs documented above Gait: Not observed HENT: WNL, normocephalic Pulmonary: normal non-labored breathing without wheezing Cardiac: regular HR; without carotid bruits Abdomen: soft, NT, aortic pulse is not palpable Skin: without rashes Vascular Exam/Pulses:  Right Left  Radial 2+ (normal) 2+ (normal)  DP 2+ (normal) 2+ (normal)   Extremities: + edema BLE with pitting from his socks.   Right leg:       Left leg:      Neurologic: A&O X 3;  moving all extremities equally  Psychiatric:  The pt has Normal affect.   Non-Invasive Vascular Imaging:   Venous duplex on 01/16/2023: Venous Reflux Times  +--------------+---------+------+-----------+------------+--------+  RIGHT        Reflux NoRefluxReflux TimeDiameter cmsComments                          Yes                                   +--------------+---------+------+-----------+------------+--------+  CFV          no                                              +--------------+---------+------+-----------+------------+--------+  FV prox       no                                              +--------------+---------+------+-----------+------------+--------+  FV mid        no                                              +--------------+---------+------+-----------+------------+--------+  FV dist       no                                              +--------------+---------+------+-----------+------------+--------+  Popliteal    no                                               +--------------+---------+------+-----------+------------+--------+  GSV at [facility]    no                            0.72              +--------------+---------+------+-----------+------------+--------+  GSV prox thighno                            0.70              +--------------+---------+------+-----------+------------+--------+  GSV mid thigh           yes    >500 ms      0.39              +--------------+---------+------+-----------+------------+--------+  GSV dist thigh          yes    >500 ms      0.34              +--------------+---------+------+-----------+------------+--------+  GSV at knee             yes    >500 ms      0.35              +--------------+---------+------+-----------+------------+--------+  GSV prox calf  yes    >500 ms      0.34              +--------------+---------+------+-----------+------------+--------+  GSV mid calf            yes    >500 ms      0.29              +--------------+---------+------+-----------+------------+--------+  GSV dist calf           yes    >500 ms      0.36              +--------------+---------+------+-----------+------------+--------+  SSV Pop Fossa no                            0.31              +--------------+---------+------+-----------+------------+--------+  SSV prox calf no                            0.27              +--------------+---------+------+-----------+------------+--------+  SSV mid calf  no                            0.33              +--------------+---------+------+-----------+------------+--------+     +--------------+---------+------+-----------+------------+--------+  LEFT         Reflux NoRefluxReflux TimeDiameter cmsComments                          Yes                                   +--------------+---------+------+-----------+------------+--------+  CFV          no                                               +--------------+---------+------+-----------+------------+--------+  FV prox       no                                              +--------------+---------+------+-----------+------------+--------+  FV mid        no                                              +--------------+---------+------+-----------+------------+--------+  FV dist       no                                              +--------------+---------+------+-----------+------------+--------+  Popliteal    no                                              +--------------+---------+------+-----------+------------+--------+  GSV at [facility]    no                            0.70              +--------------+---------+------+-----------+------------+--------+  GSV prox thighno                            0.63              +--------------+---------+------+-----------+------------+--------+  GSV mid thigh           yes    >500 ms      0.38              +--------------+---------+------+-----------+------------+--------+  GSV dist thigh          yes    >500 ms      0.37              +--------------+---------+------+-----------+------------+--------+  GSV at knee             yes    >500 ms      0.44              +--------------+---------+------+-----------+------------+--------+  GSV prox calf           yes    >500 ms      0.43              +--------------+---------+------+-----------+------------+--------+  GSV mid calf            yes    >500 ms      0.32              +--------------+---------+------+-----------+------------+--------+  GSV dist calf           yes    >500 ms      0.26              +--------------+---------+------+-----------+------------+--------+  SSV Pop Fossa           yes    >500 ms      0.51              +--------------+---------+------+-----------+------------+--------+  SSV prox calf           yes    >500 ms       0.22              +--------------+---------+------+-----------+------------+--------+  SSV mid calf            yes    >500 ms      0.30              +--------------+---------+------+-----------+------------+--------+   Summary:  Right:  - Venous reflux is noted in the right greater saphenous vein in the thigh.  - Venous reflux is noted in the right greater saphenous vein in the calf.    Left:  - Venous reflux is noted in the left greater saphenous vein in the thigh.  - Venous reflux is noted in the left greater saphenous vein in the calf.  - Venous reflux is noted in the left short saphenous vein.   ABI/TBI and BLE arterial duplex 01/26/2023: Right:  1.29/1.63 great toe pressure 212 Left: 1.28/1.76 great toe pressure 229  +-----------+--------+-----+--------+-----------+--------+  RIGHT     PSV cm/sRatioStenosisWaveform   Comments  +-----------+--------+-----+--------+-----------+--------+  CFA Prox   120                  triphasic            +-----------+--------+-----+--------+-----------+--------+  SFA Prox   87                   triphasic            +-----------+--------+-----+--------+-----------+--------+  SFA Mid    88                   triphasic            +-----------+--------+-----+--------+-----------+--------+  SFA Distal 85                   triphasic            +-----------+--------+-----+--------+-----------+--------+  POP Prox   75                   triphasic            +-----------+--------+-----+--------+-----------+--------+  POP Mid    82                   triphasic            +-----------+--------+-----+--------+-----------+--------+  POP Distal 79                   triphasic            +-----------+--------+-----+--------+-----------+--------+  TP Trunk   78                   triphasic            +-----------+--------+-----+--------+-----------+--------+  ATA Distal 53                    triphasic            +-----------+--------+-----+--------+-----------+--------+  PTA Distal 58                   triphasic            +-----------+--------+-----+--------+-----------+--------+  PERO Prox  53                   triphasic            +-----------+--------+-----+--------+-----------+--------+  PERO Mid   43                   multiphasic          +-----------+--------+-----+--------+-----------+--------+  PERO Distal32                   biphasic             +-----------+--------+-----+--------+-----------+--------+   +-----------+--------+-----+--------+-----------+--------+  LEFT      PSV cm/sRatioStenosisWaveform   Comments  +-----------+--------+-----+--------+-----------+--------+  CFA Prox   105                  triphasic            +-----------+--------+-----+--------+-----------+--------+  SFA Prox   76                   triphasic            +-----------+--------+-----+--------+-----------+--------+  SFA Mid    90                   triphasic            +-----------+--------+-----+--------+-----------+--------+  SFA Distal 93                   triphasic            +-----------+--------+-----+--------+-----------+--------+  POP Prox  87                   triphasic            +-----------+--------+-----+--------+-----------+--------+  POP Mid    84                   triphasic            +-----------+--------+-----+--------+-----------+--------+  POP Distal 81                   triphasic            +-----------+--------+-----+--------+-----------+--------+  TP Trunk   76                   triphasic            +-----------+--------+-----+--------+-----------+--------+  ATA Distal 65                   triphasic            +-----------+--------+-----+--------+-----------+--------+  PTA Distal 65                   triphasic             +-----------+--------+-----+--------+-----------+--------+  PERO Prox  72                   triphasic            +-----------+--------+-----+--------+-----------+--------+  PERO Mid   48                   multiphasic          +-----------+--------+-----+--------+-----------+--------+  PERO Distal38                   biphasic             +-----------+--------+-----+--------+-----------+--------+   Summary:  Right: No evidence of atherosclerosis throughout the lower extremity; no  evidence of stenosis in the CFA, SFA, popliteal artery and TPT.  Three vessel run-off.   Left: No evidence of atherosclerosis throughout the lower extremity; no  evidence of stenosis in the CFA, SFA, popliteal artery and TPT.  Three vessel run-off.    William Ortega is a 62 y.o. male who presents with: BLE varicose veins and BLE swelling     -pt has easily palapble DP pedal pulses bilaterally.  He did have ABI and arterial duplex, which was normal.  I reassured him about his arterial flow.  Right venous doppler: Pt does have venous reflux in the GSV throughout the thigh and calf and vein measures < 4mm.  He does not have reflux at the [facility] or in the deep system. Left venous doppler: Pt does have venous reflux in the GSV in the mid thigh to calf that measures 3mm to 4 mm, however, he does not have any reflux at the [facility] or in the deep system.   -given the above, he is not a candidate for laser ablation.    -discussed with pt about wearing thigh high 20-30 mmHg compression stockings and pt was measured for these today and did get a pair.  We discussed that if he cannot tolerate the thigh high, he can continue to wear his knee high compression.   -discussed the importance of leg elevation and how to elevate properly - pt is advised to elevate their legs and a diagram is given to them to demonstrate for pt to lay flat on their back with  knees elevated and slightly bent with their feet higher than  their knees, which puts their feet higher than their heart for 15 minutes per day.  If pt cannot lay flat, advised to lay as flat as possible.  -pt is advised to continue as much walking as possible and avoid sitting or standing for long periods of time.  Discussed about walking around during his work day if possible.  -discussed importance of weight loss and exercise and that water aerobics would also be beneficial.  -discussed with him that should any of his superficial veins ever bleed in the future, to elevate his leg and apply pressure for about 15-30 minutes and it will resolve.  -he is not interested in sclerotherapy -handout with recommendations given -pt will f/u as needed.  He knows that if anything changes  or he has concerns, he can give [place] a call and we will be glad to see him back.     Doreatha Massed, [facility] [phone]  Clinic MD:  Edilia Bo

## 2023-03-02 DIAGNOSIS — G4733 Obstructive sleep apnea (adult) (pediatric): Secondary | ICD-10-CM | POA: Diagnosis not present

## 2023-03-12 ENCOUNTER — Other Ambulatory Visit: Payer: Self-pay | Admitting: Sports Medicine

## 2023-03-12 DIAGNOSIS — E7800 Pure hypercholesterolemia, unspecified: Secondary | ICD-10-CM

## 2023-03-12 DIAGNOSIS — N139 Obstructive and reflux uropathy, unspecified: Secondary | ICD-10-CM

## 2023-04-01 DIAGNOSIS — G4733 Obstructive sleep apnea (adult) (pediatric): Secondary | ICD-10-CM | POA: Diagnosis not present

## 2023-04-30 DIAGNOSIS — G4733 Obstructive sleep apnea (adult) (pediatric): Secondary | ICD-10-CM | POA: Diagnosis not present

## 2023-05-09 ENCOUNTER — Other Ambulatory Visit: Payer: Self-pay | Admitting: Urology

## 2023-05-09 DIAGNOSIS — N139 Obstructive and reflux uropathy, unspecified: Secondary | ICD-10-CM

## 2023-05-19 ENCOUNTER — Ambulatory Visit: Payer: BC Managed Care – PPO | Admitting: Urology

## 2023-05-19 ENCOUNTER — Encounter: Payer: Self-pay | Admitting: Urology

## 2023-05-19 VITALS — BP 130/81 | HR 81 | Ht 70.0 in | Wt 215.0 lb

## 2023-05-19 DIAGNOSIS — E291 Testicular hypofunction: Secondary | ICD-10-CM | POA: Diagnosis not present

## 2023-05-19 DIAGNOSIS — N401 Enlarged prostate with lower urinary tract symptoms: Secondary | ICD-10-CM

## 2023-05-19 NOTE — Progress Notes (Signed)
   Assessment: 1. Hypogonadism in male   2. Benign prostatic hyperplasia with lower urinary tract symptoms, symptom details unspecified     Plan: Continue comb med tx for bph (may try to go to one tamsulosin daily) Continue testosterone cream  Psa today Follow-up 6 months  Chief Complaint: Urinary complaints and low testosterone  HPI: William Ortega is a 62 y.o. male who presents for continued evaluation of BPH/lower urinary tract symptoms and low testosterone. See my note 01/07/2023 at the time of initial visit for detailed history exam and plan. At that time the patient was changed from IM Depo testosterone to daily transdermal bioidentical cream 20% with follow-up testosterone level of 534 6 weeks later. Patient was also started on twice daily tamsulosin and finasteride as well for his worsening lower urinary tract symptoms.  Patient reports significant improvement in his lower urinary tract symptoms.  Current IPSS = 11 Baseline status: IPSS = 17   PVR = 100 mL   Urinalysis negative   DRE reveals a large approximately 70 g prostate without evidence of nodules or induration   PSA DATA: 11/2017             1.0 07/2019             1.6 10/2021             2.6 (31% free)    Portions of the above documentation were copied from a prior visit for review purposes only.  Allergies: Allergies  Allergen Reactions   Aluminum Rash   Nickel Rash   Penicillins Rash    PMH: Past Medical History:  Diagnosis Date   Allergy    Arthritis    Atrial fibrillation (HCC)    Bulging lumbar disc    Diabetes mellitus without complication (HCC)    Hypertension    OSA (obstructive sleep apnea)    Pneumonia     PSH: Past Surgical History:  Procedure Laterality Date   KNEE ARTHROPLASTY     pt reports just arthroscopy   NASAL SEPTUM SURGERY     UMBILICAL HERNIA REPAIR N/A 03/20/2022   Procedure: LAPAROSCOPIC ASSISTED REPAIR UMBILICAL HERNIA WITH MESH;  Surgeon: Gaynelle Adu, MD;   Location: [facility];  Service: General;  Laterality: N/A;   VASECTOMY      SH: Social History   Tobacco Use   Smoking status: Never   Smokeless tobacco: Never  Vaping Use   Vaping status: Never Used  Substance Use Topics   Alcohol use: Yes    Comment: occ   Drug use: No    ROS: Constitutional:  Negative for fever, chills, weight loss CV: Negative for chest pain, previous MI, hypertension Respiratory:  Negative for shortness of breath, wheezing, sleep apnea, frequent cough GI:  Negative for nausea, vomiting, bloody stool, GERD  PE: BP 130/81   Pulse 81   Ht 5\' 10"  (1.778 m)   Wt 215 lb (97.5 kg)   BMI 30.85 kg/m  GENERAL APPEARANCE:  Well appearing, well developed, well nourished, NAD HEENT:  Atraumatic, normocephalic, oropharynx clear NECK:  Supple without lymphadenopathy or thyromegaly ABDOMEN:  Soft, non-tender, no masses EXTREMITIES:  Moves all extremities well, without clubbing, cyanosis, or edema NEUROLOGIC:  Alert and oriented x 3, normal gait, CN II-XII grossly intact MENTAL STATUS:  appropriate BACK:  Non-tender to palpation, No CVAT SKIN:  Warm, dry, and intact   Results: No results found for this or any previous visit (from the past 24 hour(s)).

## 2023-05-20 ENCOUNTER — Ambulatory Visit: Payer: BC Managed Care – PPO | Admitting: Urology

## 2023-05-20 LAB — URINALYSIS, ROUTINE W REFLEX MICROSCOPIC
Bilirubin, UA: NEGATIVE
Glucose, UA: NEGATIVE
Ketones, UA: NEGATIVE
Leukocytes,UA: NEGATIVE
Nitrite, UA: NEGATIVE
Protein,UA: NEGATIVE
RBC, UA: NEGATIVE
Specific Gravity, UA: 1.025 (ref 1.005–1.030)
Urobilinogen, Ur: 0.2 mg/dL (ref 0.2–1.0)
pH, UA: 5.5 (ref 5.0–7.5)

## 2023-05-20 LAB — PSA: Prostate Specific Ag, Serum: 2 ng/mL (ref 0.0–4.0)

## 2023-05-30 DIAGNOSIS — G4733 Obstructive sleep apnea (adult) (pediatric): Secondary | ICD-10-CM | POA: Diagnosis not present

## 2023-06-05 ENCOUNTER — Ambulatory Visit: Payer: BC Managed Care – PPO | Admitting: Sports Medicine

## 2023-06-08 ENCOUNTER — Ambulatory Visit: Payer: BC Managed Care – PPO | Admitting: Sports Medicine

## 2023-06-08 VITALS — BP 125/77 | HR 64 | Ht 70.0 in | Wt 220.0 lb

## 2023-06-08 DIAGNOSIS — E1129 Type 2 diabetes mellitus with other diabetic kidney complication: Secondary | ICD-10-CM | POA: Diagnosis not present

## 2023-06-08 DIAGNOSIS — L918 Other hypertrophic disorders of the skin: Secondary | ICD-10-CM | POA: Diagnosis not present

## 2023-06-08 DIAGNOSIS — R809 Proteinuria, unspecified: Secondary | ICD-10-CM | POA: Diagnosis not present

## 2023-06-08 NOTE — Progress Notes (Signed)
    Procedures performed today:    Procedure:  Cryodestruction of left side of the nose skin tag Consent obtained and verified. Time-out conducted. Noted no overlying erythema, induration, or other signs of local infection. Completed without difficulty using Cryo-Gun. Advised to call if fevers/chills, erythema, induration, drainage, or persistent bleeding.  Independent interpretation of notes and tests performed by another provider:   None.  Brief History, Exam, Impression, and Recommendations:    Type 2 diabetes mellitus, controlled, with renal complications (HCC) Well-controlled, A1c 6.5%, updated on screening measures. Repeat A1c in 6 months.  Skin tag on the nose Repeat cryotherapy, return in a month if not fully gone. We can do it again at that time if needed. He also had some scattered benign seborrheic keratoses over his back.    ____________________________________________ William Ortega. Benjamin Stain, M.D., ABFM., CAQSM., AME. Primary Care and Sports Medicine [facility]  Adjunct Professor of Family Medicine  [employer]  Restaurant manager, fast food

## 2023-06-08 NOTE — Assessment & Plan Note (Signed)
Repeat cryotherapy, return in a month if not fully gone. We can do it again at that time if needed. He also had some scattered benign seborrheic keratoses over his back.

## 2023-06-08 NOTE — Assessment & Plan Note (Signed)
Well-controlled, A1c 6.5%, updated on screening measures. Repeat A1c in 6 months.

## 2023-06-11 DIAGNOSIS — M5459 Other low back pain: Secondary | ICD-10-CM | POA: Diagnosis not present

## 2023-06-11 DIAGNOSIS — M4316 Spondylolisthesis, lumbar region: Secondary | ICD-10-CM | POA: Diagnosis not present

## 2023-06-29 ENCOUNTER — Other Ambulatory Visit: Payer: Self-pay | Admitting: Sports Medicine

## 2023-06-29 DIAGNOSIS — G6289 Other specified polyneuropathies: Secondary | ICD-10-CM

## 2023-06-30 DIAGNOSIS — G4733 Obstructive sleep apnea (adult) (pediatric): Secondary | ICD-10-CM | POA: Diagnosis not present

## 2023-07-07 DIAGNOSIS — M5137 Other intervertebral disc degeneration, lumbosacral region with discogenic back pain only: Secondary | ICD-10-CM | POA: Diagnosis not present

## 2023-07-07 DIAGNOSIS — M47816 Spondylosis without myelopathy or radiculopathy, lumbar region: Secondary | ICD-10-CM | POA: Diagnosis not present

## 2023-07-07 DIAGNOSIS — M5126 Other intervertebral disc displacement, lumbar region: Secondary | ICD-10-CM | POA: Diagnosis not present

## 2023-07-07 DIAGNOSIS — M5136 Other intervertebral disc degeneration, lumbar region with discogenic back pain only: Secondary | ICD-10-CM | POA: Diagnosis not present

## 2023-07-07 DIAGNOSIS — M48061 Spinal stenosis, lumbar region without neurogenic claudication: Secondary | ICD-10-CM | POA: Diagnosis not present

## 2023-07-29 DIAGNOSIS — G4733 Obstructive sleep apnea (adult) (pediatric): Secondary | ICD-10-CM | POA: Diagnosis not present

## 2023-07-31 DIAGNOSIS — G4733 Obstructive sleep apnea (adult) (pediatric): Secondary | ICD-10-CM | POA: Diagnosis not present

## 2023-08-05 ENCOUNTER — Other Ambulatory Visit: Payer: Self-pay | Admitting: Sports Medicine

## 2023-08-05 DIAGNOSIS — N139 Obstructive and reflux uropathy, unspecified: Secondary | ICD-10-CM

## 2023-08-05 DIAGNOSIS — E119 Type 2 diabetes mellitus without complications: Secondary | ICD-10-CM

## 2023-08-06 DIAGNOSIS — M5416 Radiculopathy, lumbar region: Secondary | ICD-10-CM | POA: Diagnosis not present

## 2023-08-20 ENCOUNTER — Encounter: Payer: BC Managed Care – PPO | Admitting: Urology

## 2023-09-01 DIAGNOSIS — M5416 Radiculopathy, lumbar region: Secondary | ICD-10-CM | POA: Diagnosis not present

## 2023-09-02 ENCOUNTER — Ambulatory Visit: Payer: BC Managed Care – PPO | Admitting: Urology

## 2023-09-08 ENCOUNTER — Telehealth: Payer: Self-pay | Admitting: Internal Medicine

## 2023-09-08 MED ORDER — METOPROLOL SUCCINATE ER 25 MG PO TB24: 25.0000 mg | ORAL_TABLET | Freq: Every day | ORAL | 1 refills | Status: DC

## 2023-09-08 NOTE — Telephone Encounter (Signed)
 Pt's medication was sent to pt's pharmacy as requested. Confirmation received.

## 2023-09-08 NOTE — Telephone Encounter (Signed)
*  STAT* If patient is at the pharmacy, call can be transferred to refill team.   1. Which medications need to be refilled? (please list name of each medication and dose if known) metoprolol succinate (TOPROL-XL) 25 MG 24 hr tablet    4. Which pharmacy/location (including street and city if local pharmacy) is medication to be sent to?  [id number] - [place], [place] - [address]     5. Do they need a 30 day or 90 day supply? 90

## 2023-09-29 DIAGNOSIS — M2141 Flat foot [pes planus] (acquired), right foot: Secondary | ICD-10-CM | POA: Diagnosis not present

## 2023-09-29 DIAGNOSIS — M2142 Flat foot [pes planus] (acquired), left foot: Secondary | ICD-10-CM | POA: Diagnosis not present

## 2023-09-29 DIAGNOSIS — M79672 Pain in left foot: Secondary | ICD-10-CM | POA: Diagnosis not present

## 2023-09-29 DIAGNOSIS — M722 Plantar fascial fibromatosis: Secondary | ICD-10-CM | POA: Diagnosis not present

## 2023-10-27 DIAGNOSIS — G4733 Obstructive sleep apnea (adult) (pediatric): Secondary | ICD-10-CM | POA: Diagnosis not present

## 2023-10-27 LAB — OPHTHALMOLOGY REPORT-SCANNED

## 2023-10-28 DIAGNOSIS — G4733 Obstructive sleep apnea (adult) (pediatric): Secondary | ICD-10-CM | POA: Diagnosis not present

## 2023-11-02 ENCOUNTER — Other Ambulatory Visit: Payer: Self-pay | Admitting: Internal Medicine

## 2023-11-04 ENCOUNTER — Telehealth: Payer: Self-pay | Admitting: Internal Medicine

## 2023-11-04 MED ORDER — FLECAINIDE ACETATE 50 MG PO TABS: 50.0000 mg | ORAL_TABLET | Freq: Two times a day (BID) | ORAL | 0 refills | Status: DC

## 2023-11-04 NOTE — Telephone Encounter (Signed)
 Pt's medication was sent to pt's pharmacy as requested. Confirmation received.

## 2023-11-04 NOTE — Telephone Encounter (Signed)
*  STAT* If patient is at the pharmacy, call can be transferred to refill team.   1. Which medications need to be refilled? (please list name of each medication and dose if known)   flecainide  (TAMBOCOR ) 50 MG tablet     4. Which pharmacy/location (including street and city if local pharmacy) is medication to be sent to?   [id number] - [place], [place] - [address]     5. Do they need a 30 day or 90 day supply? 90

## 2023-11-06 ENCOUNTER — Other Ambulatory Visit: Payer: Self-pay | Admitting: Urology

## 2023-11-06 ENCOUNTER — Other Ambulatory Visit: Payer: Self-pay | Admitting: Sports Medicine

## 2023-11-06 DIAGNOSIS — N139 Obstructive and reflux uropathy, unspecified: Secondary | ICD-10-CM

## 2023-11-19 ENCOUNTER — Other Ambulatory Visit: Payer: Self-pay | Admitting: Internal Medicine

## 2023-11-19 ENCOUNTER — Other Ambulatory Visit: Payer: Self-pay | Admitting: Urology

## 2023-11-19 DIAGNOSIS — N139 Obstructive and reflux uropathy, unspecified: Secondary | ICD-10-CM

## 2023-11-21 DIAGNOSIS — M25511 Pain in right shoulder: Secondary | ICD-10-CM | POA: Diagnosis not present

## 2023-11-23 ENCOUNTER — Ambulatory Visit: Attending: Internal Medicine | Admitting: Internal Medicine

## 2023-11-23 ENCOUNTER — Encounter: Payer: Self-pay | Admitting: Internal Medicine

## 2023-11-23 VITALS — BP 114/71 | HR 75 | Resp 16 | Ht 70.0 in | Wt 222.8 lb

## 2023-11-23 DIAGNOSIS — E782 Mixed hyperlipidemia: Secondary | ICD-10-CM

## 2023-11-23 DIAGNOSIS — R600 Localized edema: Secondary | ICD-10-CM

## 2023-11-23 DIAGNOSIS — G4733 Obstructive sleep apnea (adult) (pediatric): Secondary | ICD-10-CM

## 2023-11-23 DIAGNOSIS — E7800 Pure hypercholesterolemia, unspecified: Secondary | ICD-10-CM

## 2023-11-23 DIAGNOSIS — I10 Essential (primary) hypertension: Secondary | ICD-10-CM | POA: Diagnosis not present

## 2023-11-23 DIAGNOSIS — I480 Paroxysmal atrial fibrillation: Secondary | ICD-10-CM | POA: Diagnosis not present

## 2023-11-23 DIAGNOSIS — E119 Type 2 diabetes mellitus without complications: Secondary | ICD-10-CM

## 2023-11-23 DIAGNOSIS — I8393 Asymptomatic varicose veins of bilateral lower extremities: Secondary | ICD-10-CM | POA: Diagnosis not present

## 2023-11-23 DIAGNOSIS — Z79899 Other long term (current) drug therapy: Secondary | ICD-10-CM

## 2023-11-23 MED ORDER — VALSARTAN 40 MG PO TABS
40.0000 mg | ORAL_TABLET | Freq: Every day | ORAL | 3 refills | Status: DC
Start: 2023-11-23 — End: 2023-12-23

## 2023-11-23 MED ORDER — ASPIRIN EC 81 MG PO TBEC
81.0000 mg | DELAYED_RELEASE_TABLET | Freq: Every day | ORAL | 3 refills | Status: AC
Start: 2023-11-23 — End: ?

## 2023-11-23 MED ORDER — FLECAINIDE ACETATE 50 MG PO TABS: 50.0000 mg | ORAL_TABLET | Freq: Two times a day (BID) | ORAL | 3 refills | Status: AC

## 2023-11-23 MED ORDER — METOPROLOL SUCCINATE ER 25 MG PO TB24: 25.0000 mg | ORAL_TABLET | Freq: Every day | ORAL | 3 refills | Status: AC

## 2023-11-23 MED ORDER — ROSUVASTATIN CALCIUM 10 MG PO TABS
10.0000 mg | ORAL_TABLET | Freq: Every day | ORAL | 3 refills | Status: AC
Start: 2023-11-23 — End: ?

## 2023-11-23 NOTE — Patient Instructions (Signed)
 Medication Instructions:  No Changes *If you need a refill on your cardiac medications before your next appointment, please call your pharmacy*  Lab Work: None  Follow-Up: At [facility], you and your health needs are our priority.  As part of our continuing mission to provide you with exceptional heart care, our providers are all part of one team.  This team includes your primary Cardiologist (physician) and Advanced Practice Providers or APPs (Physician Assistants and Nurse Practitioners) who all work together to provide you with the care you need, when you need it.  Your next appointment:   1 year(s)  Provider:   Gayatri A Acharya, MD

## 2023-11-23 NOTE — Progress Notes (Signed)
  Cardiology Office Note:  .   Date:  11/23/2023  ID:  William Ortega, DOB January 19, 1961, MRN [id number] PCP: Gean Keels, MD  [facility] Providers Cardiologist:  None    History of Present Illness: .   William Ortega is a 63 y.o. male.  Discussed the use of AI scribe software for clinical note transcription with the patient, who gave verbal consent to proceed.  History of Present Illness William Ortega is a 63 year old male with paroxysmal atrial fibrillation who presents for follow-up.  He is managed for paroxysmal atrial fibrillation with flecainide  50 mg twice daily, experiencing no chest pain or shortness of breath while on this medication. EKG normal.  He is compliant with CPAP therapy for obstructive sleep apnea, with no new symptoms.  He experiences lower extremity swelling, attributed to venous issues, and finds compression socks effective in managing the swelling, especially while working on his feet.  Hypertension is well-controlled with metoprolol  succinate 25 mg daily and valsartan  40 mg daily, with consistent blood pressure readings.  He is on rosuvastatin  10 mg daily for hyperlipidemia, with the last cholesterol check in 2023 and plans for re-evaluation during the next physical exam.    ROS: negative except per HPI above.  Studies Reviewed: .        Results DIAGNOSTIC EKG: Normal (11/23/2023) Risk Assessment/Calculations:      Physical Exam:   VS:  There were no vitals taken for this visit.   Wt Readings from Last 3 Encounters:  06/08/23 220 lb (99.8 kg)  05/19/23 215 lb (97.5 kg)  02/26/23 217 lb (98.4 kg)     Physical Exam VITALS: BP- 114/71 GENERAL: Alert, cooperative, well developed, no acute distress HEENT: Normocephalic, normal oropharynx, moist mucous membranes CHEST: Clear to auscultation bilaterally, no wheezes, rhonchi, or crackles CARDIOVASCULAR: Normal heart rate and rhythm, S1 and S2 normal without murmurs ABDOMEN:  Soft, non-tender, non-distended, without organomegaly, normal bowel sounds EXTREMITIES: No cyanosis or edema NEUROLOGICAL: Cranial nerves grossly intact, moves all extremities without gross motor or sensory deficit   ASSESSMENT AND PLAN: .    Assessment and Plan Assessment & Plan Paroxysmal atrial fibrillation Long term use of antiarrhythmic drug Paroxysmal atrial fibrillation is well-managed on flecainide  50 mg twice daily. EKG is normal. Emphasized the importance of maintaining flecainide  supply and potential dose adjustments through the AFib clinic. - Refill flecainide  for one year. Maintaining SR, not on anticoagulation. - Report any chest pain or dyspnea. - Refer to AFib clinic for dose adjustments if necessary.  Hypertension Hypertension is well-controlled with metoprolol  succinate 25 mg daily and valsartan  40 mg daily. Blood pressure is 114/71 mmHg. Continue current therapy.  Hyperlipidemia Hyperlipidemia is managed with rosuvastatin  10 mg daily. Discussed the need for regular cholesterol monitoring. - Coordinate with primary care provider for cholesterol check during next physical examination.  Peripheral edema Peripheral edema is managed with compression socks and leg elevation. Vascular evaluation confirmed venous insufficiency as the cause. Compression therapy is effective. - Continue compression therapy and leg elevation.      Grady Lawman, MD, [facility]

## 2023-11-23 NOTE — Addendum Note (Signed)
 Addended by: Maleiyah Releford C on: 11/23/2023 06:20 PM   Modules accepted: Orders

## 2023-11-27 DIAGNOSIS — G4733 Obstructive sleep apnea (adult) (pediatric): Secondary | ICD-10-CM | POA: Diagnosis not present

## 2023-12-07 ENCOUNTER — Ambulatory Visit: Payer: BC Managed Care – PPO | Admitting: Sports Medicine

## 2023-12-14 DIAGNOSIS — M75111 Incomplete rotator cuff tear or rupture of right shoulder, not specified as traumatic: Secondary | ICD-10-CM | POA: Diagnosis not present

## 2023-12-14 DIAGNOSIS — Z1389 Encounter for screening for other disorder: Secondary | ICD-10-CM | POA: Diagnosis not present

## 2023-12-14 DIAGNOSIS — M75121 Complete rotator cuff tear or rupture of right shoulder, not specified as traumatic: Secondary | ICD-10-CM | POA: Diagnosis not present

## 2023-12-14 DIAGNOSIS — M19011 Primary osteoarthritis, right shoulder: Secondary | ICD-10-CM | POA: Diagnosis not present

## 2023-12-14 DIAGNOSIS — Z135 Encounter for screening for eye and ear disorders: Secondary | ICD-10-CM | POA: Diagnosis not present

## 2023-12-21 ENCOUNTER — Encounter: Admitting: Sports Medicine

## 2023-12-21 DIAGNOSIS — S46011D Strain of muscle(s) and tendon(s) of the rotator cuff of right shoulder, subsequent encounter: Secondary | ICD-10-CM | POA: Diagnosis not present

## 2023-12-23 ENCOUNTER — Encounter: Payer: Self-pay | Admitting: Sports Medicine

## 2023-12-23 ENCOUNTER — Other Ambulatory Visit: Payer: Self-pay | Admitting: Sports Medicine

## 2023-12-23 ENCOUNTER — Ambulatory Visit (INDEPENDENT_AMBULATORY_CARE_PROVIDER_SITE_OTHER): Admitting: Sports Medicine

## 2023-12-23 VITALS — BP 104/60 | HR 66 | Resp 20 | Ht 70.0 in | Wt 219.0 lb

## 2023-12-23 DIAGNOSIS — Z7984 Long term (current) use of oral hypoglycemic drugs: Secondary | ICD-10-CM

## 2023-12-23 DIAGNOSIS — E1129 Type 2 diabetes mellitus with other diabetic kidney complication: Secondary | ICD-10-CM | POA: Diagnosis not present

## 2023-12-23 DIAGNOSIS — N139 Obstructive and reflux uropathy, unspecified: Secondary | ICD-10-CM | POA: Diagnosis not present

## 2023-12-23 DIAGNOSIS — R809 Proteinuria, unspecified: Secondary | ICD-10-CM

## 2023-12-23 DIAGNOSIS — E119 Type 2 diabetes mellitus without complications: Secondary | ICD-10-CM

## 2023-12-23 DIAGNOSIS — Z0000 Encounter for general adult medical examination without abnormal findings: Secondary | ICD-10-CM

## 2023-12-23 MED ORDER — FINASTERIDE 5 MG PO TABS
5.0000 mg | ORAL_TABLET | Freq: Every day | ORAL | 3 refills | Status: DC
Start: 2023-12-23 — End: 2024-04-06

## 2023-12-23 MED ORDER — VALSARTAN 40 MG PO TABS
40.0000 mg | ORAL_TABLET | Freq: Every day | ORAL | 3 refills | Status: AC
Start: 2023-12-23 — End: ?

## 2023-12-23 NOTE — Progress Notes (Signed)
 Subjective:    CC: Annual Physical Exam  HPI:  This patient is here for their annual physical  I reviewed the past medical history, family history, social history, surgical history, and allergies today and no changes were needed.  Please see the problem list section below in epic for further details.  Past Medical History: Past Medical History:  Diagnosis Date   Allergy    Arthritis    Atrial fibrillation (HCC)    Bulging lumbar disc    Diabetes mellitus without complication (HCC)    Hypertension    OSA (obstructive sleep apnea)    Pneumonia    Past Surgical History: Past Surgical History:  Procedure Laterality Date   KNEE ARTHROPLASTY     pt reports just arthroscopy   NASAL SEPTUM SURGERY     UMBILICAL HERNIA REPAIR N/A 03/20/2022   Procedure: LAPAROSCOPIC ASSISTED REPAIR UMBILICAL HERNIA WITH MESH;  Surgeon: Tanda Locus, MD;  Location: [facility];  Service: General;  Laterality: N/A;   VASECTOMY     Social History: Social History   Socioeconomic History   Marital status: Married    Spouse name: Not on file   Number of children: Not on file   Years of education: Not on file   Highest education level: Associate degree: occupational, Scientist, product/process development, or vocational program  Occupational History   Not on file  Tobacco Use   Smoking status: Never   Smokeless tobacco: Never  Vaping Use   Vaping status: Never Used  Substance and Sexual Activity   Alcohol use: Yes    Comment: occ   Drug use: No   Sexual activity: Yes  Other Topics Concern   Not on file  Social History Narrative   Not on file   Social Drivers of Health   Financial Resource Strain: Low Risk  (12/22/2023)   Overall Financial Resource Strain (CARDIA)    Difficulty of Paying Living Expenses: Not hard at all  Food Insecurity: No Food Insecurity (12/22/2023)   Hunger Vital Sign    Worried About Running Out of Food in the Last Year: Never true    Ran Out of Food in the Last Year: Never true  Transportation  Needs: No Transportation Needs (12/22/2023)   PRAPARE - Administrator, Civil Service (Medical): No    Lack of Transportation (Non-Medical): No  Physical Activity: Sufficiently Active (12/22/2023)   Exercise Vital Sign    Days of Exercise per Week: 7 days    Minutes of Exercise per Session: 90 min  Stress: No Stress Concern Present (12/22/2023)   [employer] of Occupational Health - Occupational Stress Questionnaire    Feeling of Stress: Not at all  Social Connections: Moderately Integrated (12/22/2023)   Social Connection and Isolation Panel    Frequency of Communication with Friends and Family: More than three times a week    Frequency of Social Gatherings with Friends and Family: Once a week    Attends Religious Services: 1 to 4 times per year    Active Member of [employer] or Organizations: No    Attends Engineer, structural: Not on file    Marital Status: Married   Family History: Family History  Problem Relation Age of Onset   Heart disease Mother    Stroke Paternal Grandmother    Allergies: Allergies  Allergen Reactions   Aluminum Rash   Nickel Rash   Penicillins Rash   Medications: See med rec.  Review of Systems: No headache, visual changes, nausea, vomiting,  diarrhea, constipation, dizziness, abdominal pain, skin rash, fevers, chills, night sweats, swollen lymph nodes, weight loss, chest pain, body aches, joint swelling, muscle aches, shortness of breath, mood changes, visual or auditory hallucinations.  Objective:    General: Well Developed, well nourished, and in no acute distress.  Neuro: Alert and oriented x3, extra-ocular muscles intact, sensation grossly intact. Cranial nerves II through XII are intact, motor, sensory, and coordinative functions are all intact. HEENT: Normocephalic, atraumatic, pupils equal round reactive to light, neck supple, no masses, no lymphadenopathy, thyroid  nonpalpable. Oropharynx, nasopharynx, external ear canals are  unremarkable. Skin: Warm and dry, no rashes noted.  Cardiac: Regular rate and rhythm, no murmurs rubs or gallops.  Respiratory: Clear to auscultation bilaterally. Not using accessory muscles, speaking in full sentences.  Abdominal: Soft, nontender, nondistended, positive bowel sounds, no masses, no organomegaly.  Musculoskeletal: Shoulder, elbow, wrist, hip, knee, ankle stable, and with full range of motion. Hypoesthesia both feet.  Impression and Recommendations:    The patient was counselled, risk factors were discussed, anticipatory guidance given.  Annual physical exam Annual physical as above. Up-to-date on screening measures.   ____________________________________________ Debby PARAS. Curtis, M.D., ABFM., CAQSM., AME. Primary Care and Sports Medicine [facility]  Adjunct Professor of [facility] Medicine  [employer]  Restaurant manager, fast food

## 2023-12-23 NOTE — Assessment & Plan Note (Signed)
Annual physical as above. Up-to-date on screening measures. 

## 2023-12-24 ENCOUNTER — Ambulatory Visit: Payer: Self-pay | Admitting: Sports Medicine

## 2023-12-24 LAB — CBC
Hematocrit: 39.7 % (ref 37.5–51.0)
Hemoglobin: 13.2 g/dL (ref 13.0–17.7)
MCH: 32.1 pg (ref 26.6–33.0)
MCHC: 33.2 g/dL (ref 31.5–35.7)
MCV: 97 fL (ref 79–97)
Platelets: 146 x10E3/uL — ABNORMAL LOW (ref 150–450)
RBC: 4.11 x10E6/uL — ABNORMAL LOW (ref 4.14–5.80)
RDW: 12.1 % (ref 11.6–15.4)
WBC: 4.2 x10E3/uL (ref 3.4–10.8)

## 2023-12-24 LAB — LIPID PANEL
Chol/HDL Ratio: 3.3 ratio (ref 0.0–5.0)
Cholesterol, Total: 111 mg/dL (ref 100–199)
HDL: 34 mg/dL — ABNORMAL LOW (ref >39)
LDL Chol Calc (NIH): 58 mg/dL (ref 0–99)
Triglycerides: 99 mg/dL (ref 0–149)
VLDL Cholesterol Cal: 19 mg/dL (ref 5–40)

## 2023-12-24 LAB — COMPREHENSIVE METABOLIC PANEL WITH GFR
ALT: 66 IU/L — ABNORMAL HIGH (ref 0–44)
AST: 44 IU/L — ABNORMAL HIGH (ref 0–40)
Albumin: 4.5 g/dL (ref 3.9–4.9)
Alkaline Phosphatase: 62 IU/L (ref 44–121)
BUN/Creatinine Ratio: 18 (ref 10–24)
BUN: 18 mg/dL (ref 8–27)
Bilirubin Total: 0.9 mg/dL (ref 0.0–1.2)
CO2: 22 mmol/L (ref 20–29)
Calcium: 9.2 mg/dL (ref 8.6–10.2)
Chloride: 102 mmol/L (ref 96–106)
Creatinine, Ser: 1.01 mg/dL (ref 0.76–1.27)
Globulin, Total: 2.1 g/dL (ref 1.5–4.5)
Glucose: 137 mg/dL — ABNORMAL HIGH (ref 70–99)
Potassium: 4.7 mmol/L (ref 3.5–5.2)
Sodium: 141 mmol/L (ref 134–144)
Total Protein: 6.6 g/dL (ref 6.0–8.5)
eGFR: 85 mL/min/1.73 (ref >59)

## 2023-12-24 LAB — HEMOGLOBIN A1C
Est. average glucose Bld gHb Est-mCnc: 163 mg/dL
Hgb A1c MFr Bld: 7.3 % — ABNORMAL HIGH (ref 4.8–5.6)

## 2023-12-24 LAB — MICROALBUMIN / CREATININE URINE RATIO
Creatinine, Urine: 66.3 mg/dL
Microalb/Creat Ratio: <5 mg/g{creat} (ref 0–29)
Microalbumin, Urine: <3 ug/mL

## 2023-12-24 LAB — PSA, TOTAL AND FREE
PSA, Free Pct: 14.4 %
PSA, Free: 0.26 ng/mL
Prostate Specific Ag, Serum: 1.8 ng/mL (ref 0.0–4.0)

## 2023-12-24 LAB — TSH: TSH: 2.58 u[IU]/mL (ref 0.450–4.500)

## 2023-12-27 DIAGNOSIS — G4733 Obstructive sleep apnea (adult) (pediatric): Secondary | ICD-10-CM | POA: Diagnosis not present

## 2024-01-11 DIAGNOSIS — M25511 Pain in right shoulder: Secondary | ICD-10-CM | POA: Diagnosis not present

## 2024-01-12 DIAGNOSIS — M75111 Incomplete rotator cuff tear or rupture of right shoulder, not specified as traumatic: Secondary | ICD-10-CM | POA: Diagnosis not present

## 2024-01-12 DIAGNOSIS — M24111 Other articular cartilage disorders, right shoulder: Secondary | ICD-10-CM | POA: Diagnosis not present

## 2024-01-12 DIAGNOSIS — M7541 Impingement syndrome of right shoulder: Secondary | ICD-10-CM | POA: Diagnosis not present

## 2024-01-12 DIAGNOSIS — M25511 Pain in right shoulder: Secondary | ICD-10-CM | POA: Diagnosis not present

## 2024-01-12 DIAGNOSIS — M19011 Primary osteoarthritis, right shoulder: Secondary | ICD-10-CM | POA: Diagnosis not present

## 2024-01-12 DIAGNOSIS — G8918 Other acute postprocedural pain: Secondary | ICD-10-CM | POA: Diagnosis not present

## 2024-01-12 DIAGNOSIS — M75121 Complete rotator cuff tear or rupture of right shoulder, not specified as traumatic: Secondary | ICD-10-CM | POA: Diagnosis not present

## 2024-01-12 DIAGNOSIS — M7521 Bicipital tendinitis, right shoulder: Secondary | ICD-10-CM | POA: Diagnosis not present

## 2024-01-12 DIAGNOSIS — S46211A Strain of muscle, fascia and tendon of other parts of biceps, right arm, initial encounter: Secondary | ICD-10-CM | POA: Diagnosis not present

## 2024-01-26 DIAGNOSIS — G4733 Obstructive sleep apnea (adult) (pediatric): Secondary | ICD-10-CM | POA: Diagnosis not present

## 2024-01-28 DIAGNOSIS — G4733 Obstructive sleep apnea (adult) (pediatric): Secondary | ICD-10-CM | POA: Diagnosis not present

## 2024-02-03 DIAGNOSIS — S46011D Strain of muscle(s) and tendon(s) of the rotator cuff of right shoulder, subsequent encounter: Secondary | ICD-10-CM | POA: Diagnosis not present

## 2024-02-03 DIAGNOSIS — R6889 Other general symptoms and signs: Secondary | ICD-10-CM | POA: Diagnosis not present

## 2024-02-09 ENCOUNTER — Encounter: Payer: Self-pay | Admitting: Sports Medicine

## 2024-02-10 DIAGNOSIS — S46011D Strain of muscle(s) and tendon(s) of the rotator cuff of right shoulder, subsequent encounter: Secondary | ICD-10-CM | POA: Diagnosis not present

## 2024-02-10 DIAGNOSIS — R6889 Other general symptoms and signs: Secondary | ICD-10-CM | POA: Diagnosis not present

## 2024-02-17 DIAGNOSIS — S46011D Strain of muscle(s) and tendon(s) of the rotator cuff of right shoulder, subsequent encounter: Secondary | ICD-10-CM | POA: Diagnosis not present

## 2024-02-17 DIAGNOSIS — R6889 Other general symptoms and signs: Secondary | ICD-10-CM | POA: Diagnosis not present

## 2024-02-24 DIAGNOSIS — R6889 Other general symptoms and signs: Secondary | ICD-10-CM | POA: Diagnosis not present

## 2024-02-24 DIAGNOSIS — S46011D Strain of muscle(s) and tendon(s) of the rotator cuff of right shoulder, subsequent encounter: Secondary | ICD-10-CM | POA: Diagnosis not present

## 2024-03-02 DIAGNOSIS — M25531 Pain in right wrist: Secondary | ICD-10-CM | POA: Diagnosis not present

## 2024-03-02 DIAGNOSIS — S46011D Strain of muscle(s) and tendon(s) of the rotator cuff of right shoulder, subsequent encounter: Secondary | ICD-10-CM | POA: Diagnosis not present

## 2024-03-02 DIAGNOSIS — G5601 Carpal tunnel syndrome, right upper limb: Secondary | ICD-10-CM | POA: Diagnosis not present

## 2024-03-02 DIAGNOSIS — R6889 Other general symptoms and signs: Secondary | ICD-10-CM | POA: Diagnosis not present

## 2024-03-09 DIAGNOSIS — S46011D Strain of muscle(s) and tendon(s) of the rotator cuff of right shoulder, subsequent encounter: Secondary | ICD-10-CM | POA: Diagnosis not present

## 2024-03-09 DIAGNOSIS — R6889 Other general symptoms and signs: Secondary | ICD-10-CM | POA: Diagnosis not present

## 2024-03-16 DIAGNOSIS — S46011D Strain of muscle(s) and tendon(s) of the rotator cuff of right shoulder, subsequent encounter: Secondary | ICD-10-CM | POA: Diagnosis not present

## 2024-03-16 DIAGNOSIS — R6889 Other general symptoms and signs: Secondary | ICD-10-CM | POA: Diagnosis not present

## 2024-03-23 DIAGNOSIS — R6889 Other general symptoms and signs: Secondary | ICD-10-CM | POA: Diagnosis not present

## 2024-03-23 DIAGNOSIS — S46011D Strain of muscle(s) and tendon(s) of the rotator cuff of right shoulder, subsequent encounter: Secondary | ICD-10-CM | POA: Diagnosis not present

## 2024-03-30 DIAGNOSIS — R6889 Other general symptoms and signs: Secondary | ICD-10-CM | POA: Diagnosis not present

## 2024-03-30 DIAGNOSIS — S46011D Strain of muscle(s) and tendon(s) of the rotator cuff of right shoulder, subsequent encounter: Secondary | ICD-10-CM | POA: Diagnosis not present

## 2024-04-01 ENCOUNTER — Other Ambulatory Visit: Payer: Self-pay | Admitting: Urology

## 2024-04-01 DIAGNOSIS — N139 Obstructive and reflux uropathy, unspecified: Secondary | ICD-10-CM

## 2024-04-05 ENCOUNTER — Telehealth: Payer: Self-pay | Admitting: Urology

## 2024-04-05 DIAGNOSIS — G5603 Carpal tunnel syndrome, bilateral upper limbs: Secondary | ICD-10-CM | POA: Diagnosis not present

## 2024-04-05 NOTE — Telephone Encounter (Signed)
 Patient called in today to have finasteride  (PROSCAR ) 5 MG tablet [[id number]] medication refilled.  Reviewed pharmacy with patient. [facility]  Yes medication refill sent as requested per patient.  Has the patient been seen within the last year? yes Does the patient need an appointment?   Scheduled with [place] 04/28/24 prior hall patient

## 2024-04-06 ENCOUNTER — Other Ambulatory Visit: Payer: Self-pay

## 2024-04-06 DIAGNOSIS — N139 Obstructive and reflux uropathy, unspecified: Secondary | ICD-10-CM

## 2024-04-06 MED ORDER — FINASTERIDE 5 MG PO TABS: 5.0000 mg | ORAL_TABLET | Freq: Every day | ORAL | 0 refills | Status: DC

## 2024-04-06 NOTE — Telephone Encounter (Signed)
 Refill sent.

## 2024-04-20 DIAGNOSIS — S46011D Strain of muscle(s) and tendon(s) of the rotator cuff of right shoulder, subsequent encounter: Secondary | ICD-10-CM | POA: Diagnosis not present

## 2024-04-20 DIAGNOSIS — G5601 Carpal tunnel syndrome, right upper limb: Secondary | ICD-10-CM | POA: Diagnosis not present

## 2024-04-28 ENCOUNTER — Ambulatory Visit: Admitting: Urology

## 2024-05-19 DIAGNOSIS — M4722 Other spondylosis with radiculopathy, cervical region: Secondary | ICD-10-CM | POA: Diagnosis not present

## 2024-05-31 ENCOUNTER — Encounter: Payer: Self-pay | Admitting: Physician Assistant

## 2024-05-31 DIAGNOSIS — G6289 Other specified polyneuropathies: Secondary | ICD-10-CM

## 2024-06-01 MED ORDER — GABAPENTIN 300 MG PO CAPS: 300.0000 mg | ORAL_CAPSULE | Freq: Two times a day (BID) | ORAL | 0 refills | Status: AC

## 2024-06-04 ENCOUNTER — Other Ambulatory Visit: Payer: Self-pay | Admitting: Urgent Care

## 2024-06-08 DIAGNOSIS — S46011D Strain of muscle(s) and tendon(s) of the rotator cuff of right shoulder, subsequent encounter: Secondary | ICD-10-CM | POA: Diagnosis not present

## 2024-06-23 ENCOUNTER — Ambulatory Visit: Admitting: Urology

## 2024-06-23 ENCOUNTER — Encounter: Payer: Self-pay | Admitting: Urology

## 2024-06-23 VITALS — BP 134/78 | HR 65 | Ht 70.0 in | Wt 222.0 lb

## 2024-06-23 DIAGNOSIS — N139 Obstructive and reflux uropathy, unspecified: Secondary | ICD-10-CM

## 2024-06-23 DIAGNOSIS — E291 Testicular hypofunction: Secondary | ICD-10-CM | POA: Diagnosis not present

## 2024-06-23 DIAGNOSIS — N401 Enlarged prostate with lower urinary tract symptoms: Secondary | ICD-10-CM

## 2024-06-23 LAB — URINALYSIS, ROUTINE W REFLEX MICROSCOPIC
Bilirubin, UA: NEGATIVE
Glucose, UA: NEGATIVE
Ketones, UA: NEGATIVE
Leukocytes,UA: NEGATIVE
Nitrite, UA: NEGATIVE
Protein,UA: NEGATIVE
RBC, UA: NEGATIVE
Specific Gravity, UA: 1.01 (ref 1.005–1.030)
Urobilinogen, Ur: 1 mg/dL (ref 0.2–1.0)
pH, UA: 5.5 (ref 5.0–7.5)

## 2024-06-23 MED ORDER — FINASTERIDE 5 MG PO TABS: 5.0000 mg | ORAL_TABLET | Freq: Every day | ORAL | 3 refills | Status: AC

## 2024-06-23 MED ORDER — TAMSULOSIN HCL 0.4 MG PO CAPS: 0.4000 mg | ORAL_CAPSULE | Freq: Every day | ORAL | 3 refills | Status: AC

## 2024-06-23 NOTE — Progress Notes (Signed)
 "  Assessment: 1. Hypogonadism in male   2. Benign prostatic hyperplasia with lower urinary tract symptoms, symptom details unspecified     Plan: I personally reviewed the patient's chart including provider notes, lab results. Continue combination therapy with tamsulosin  and finasteride . Continue bioidentical testosterone  cream 20% daily.  Refill sent to [facility]. Labs today: Testosterone , CBC Return to office in 6 months.  Chief Complaint: Chief Complaint  Patient presents with   Hypogonadism    HPI: William Ortega is a 64 y.o. male who presents for continued evaluation of BPH with lower urinary tract symptoms and hypogonadism. He was previously followed by Dr. Shona with his last visit in 12/24.  He has a history of BPH with lower urinary tract symptoms.  He has been managed with tamsulosin  twice daily and finasteride .  He noted significant improvement in his lower urinary tract symptoms with medical therapy. IPSS in 12/24 = 11. PVR = 100 mL. PSA 12/24: 2.0 PSA 7/25: 1.8  He has a history of hypogonadism.  He was originally on IM testosterone  injections and was changed to transdermal bioidentical cream 20%. Testosterone  level from 9/24: 534. H&H 7/25: 13.2/39.7  He returns today for follow-up.  He continues on combination therapy with tamsulosin  and finasteride .  He reports that his lower urinary tract symptoms are fairly stable and well-controlled.  He does have some frequency which he attributes to his diabetes.  No dysuria or gross hematuria. IPSS = 17/2. He continues to use bioidentical testosterone  cream 20%.  He reports symptomatic improvement with this.  Portions of the above documentation were copied from a prior visit for review purposes only.  Allergies: Allergies[1]  PMH: Past Medical History:  Diagnosis Date   Allergy    Arthritis    Atrial fibrillation (HCC)    Bulging lumbar disc    Diabetes mellitus without complication (HCC)     Hypertension    OSA (obstructive sleep apnea)    Pneumonia     PSH: Past Surgical History:  Procedure Laterality Date   KNEE ARTHROPLASTY     pt reports just arthroscopy   NASAL SEPTUM SURGERY     UMBILICAL HERNIA REPAIR N/A 03/20/2022   Procedure: LAPAROSCOPIC ASSISTED REPAIR UMBILICAL HERNIA WITH MESH;  Surgeon: Tanda Locus, MD;  Location: [facility];  Service: General;  Laterality: N/A;   VASECTOMY      SH: Social History[2]  ROS: Constitutional:  Negative for fever, chills, weight loss CV: Negative for chest pain, previous MI, hypertension Respiratory:  Negative for shortness of breath, wheezing, sleep apnea, frequent cough GI:  Negative for nausea, vomiting, bloody stool, GERD  PE: BP 134/78   Pulse 65   Ht 5' 10 (1.778 m)   Wt 222 lb (100.7 kg)   BMI 31.85 kg/m  GENERAL APPEARANCE:  Well appearing, well developed, well nourished, NAD HEENT:  Atraumatic, normocephalic, oropharynx clear NECK:  Supple without lymphadenopathy or thyromegaly ABDOMEN:  Soft, non-tender, no masses EXTREMITIES:  Moves all extremities well, without clubbing, cyanosis, or edema NEUROLOGIC:  Alert and oriented x 3, normal gait, CN II-XII grossly intact MENTAL STATUS:  appropriate BACK:  Non-tender to palpation, No CVAT SKIN:  Warm, dry, and intact    Results: U/A: negative     [1]  Allergies Allergen Reactions   Aluminum Rash   Nickel Rash   Penicillins Rash  [2]  Social History Tobacco Use   Smoking status: Never   Smokeless tobacco: Never  Vaping Use   Vaping status:  Never Used  Substance Use Topics   Alcohol use: Yes    Comment: occ   Drug use: No   "

## 2024-06-24 ENCOUNTER — Ambulatory Visit: Admitting: Physician Assistant

## 2024-06-24 ENCOUNTER — Ambulatory Visit: Payer: Self-pay | Admitting: Urology

## 2024-06-24 LAB — CBC
Hematocrit: 38.6 % (ref 37.5–51.0)
Hemoglobin: 12.9 g/dL — ABNORMAL LOW (ref 13.0–17.7)
MCH: 32.5 pg (ref 26.6–33.0)
MCHC: 33.4 g/dL (ref 31.5–35.7)
MCV: 97 fL (ref 79–97)
Platelets: 135 x10E3/uL — ABNORMAL LOW (ref 150–450)
RBC: 3.97 x10E6/uL — ABNORMAL LOW (ref 4.14–5.80)
RDW: 12.4 % (ref 11.6–15.4)
WBC: 5.2 x10E3/uL (ref 3.4–10.8)

## 2024-06-24 LAB — TESTOSTERONE: Testosterone: 193 ng/dL — ABNORMAL LOW (ref 264–916)

## 2024-07-01 ENCOUNTER — Encounter: Payer: Self-pay | Admitting: Physician Assistant

## 2024-07-01 ENCOUNTER — Ambulatory Visit: Admitting: Physician Assistant

## 2024-07-01 VITALS — BP 135/83 | HR 83 | Ht 70.0 in | Wt 222.0 lb

## 2024-07-01 DIAGNOSIS — I480 Paroxysmal atrial fibrillation: Secondary | ICD-10-CM

## 2024-07-01 DIAGNOSIS — Z0000 Encounter for general adult medical examination without abnormal findings: Secondary | ICD-10-CM

## 2024-07-01 DIAGNOSIS — E1140 Type 2 diabetes mellitus with diabetic neuropathy, unspecified: Secondary | ICD-10-CM

## 2024-07-01 DIAGNOSIS — E7800 Pure hypercholesterolemia, unspecified: Secondary | ICD-10-CM | POA: Diagnosis not present

## 2024-07-01 DIAGNOSIS — Z7985 Long-term (current) use of injectable non-insulin antidiabetic drugs: Secondary | ICD-10-CM

## 2024-07-01 DIAGNOSIS — I878 Other specified disorders of veins: Secondary | ICD-10-CM | POA: Diagnosis not present

## 2024-07-01 DIAGNOSIS — Z23 Encounter for immunization: Secondary | ICD-10-CM | POA: Diagnosis not present

## 2024-07-01 DIAGNOSIS — E291 Testicular hypofunction: Secondary | ICD-10-CM

## 2024-07-01 DIAGNOSIS — E1129 Type 2 diabetes mellitus with other diabetic kidney complication: Secondary | ICD-10-CM

## 2024-07-01 LAB — POCT GLYCOSYLATED HEMOGLOBIN (HGB A1C): Hemoglobin A1C: 7 % — AB (ref 4.0–5.6)

## 2024-07-01 LAB — POCT UA - MICROALBUMIN
Albumin/Creatinine Ratio, Urine, POC: <30
Creatinine, POC: 100 mg/dL
Microalbumin Ur, POC: 30 mg/L

## 2024-07-01 MED ORDER — MOUNJARO 2.5 MG/0.5ML ~~LOC~~ SOAJ: 2.5000 mg | SUBCUTANEOUS | 0 refills | Status: AC

## 2024-07-01 MED ORDER — TIRZEPATIDE 5 MG/0.5ML ~~LOC~~ SOAJ: 5.0000 mg | SUBCUTANEOUS | 0 refills | Status: AC

## 2024-07-01 MED ORDER — METFORMIN HCL 500 MG PO TABS: 500.0000 mg | ORAL_TABLET | Freq: Two times a day (BID) | ORAL | 3 refills | Status: AC

## 2024-07-01 NOTE — Patient Instructions (Addendum)
 Stay on metformin  and add mounjaro  weekly.  Get labs today

## 2024-07-01 NOTE — Progress Notes (Signed)
 "  Established Patient Office Visit  Subjective   Patient ID: William Ortega, male    DOB: 1960/08/17  Age: 64 y.o. MRN: [id number]  Chief Complaint  Patient presents with   Medical Management of Chronic Issues    HPI .Discussed the use of AI scribe software for clinical note transcription with the patient, who gave verbal consent to proceed.  History of Present Illness William Ortega is a 64 year old male who presents for lab work and medication review.  Hypertension - Elevated blood pressure attributed to decreased treadmill use - No chest pain, shortness of breath, or palpitations  Diabetes mellitus management - Currently taking metformin  - Interested in switching to a medication with renal protective benefits, such as Farxiga, due to concerns about diclofenac 's renal effects - Previously used Xigduo  and Mounjaro ; Mounjaro  improved weight loss and glycemic control but discontinued due to availability and insurance issues - Recent eye exam for diabetes at [facility] - Microalbumin levels within normal limits - Continues to monitor GFR and other laboratory results  Lower extremity edema - History of venous insufficiency causing leg swelling, especially when standing on concrete at work - Swelling managed with compression stockings - Edema less severe when not standing for prolonged periods - Cardiologist confirmed arterial circulation is intact; venous insufficiency is the cause  Cardiac arrhythmia management - Currently taking flecainide  for cardiac management - Medication prescribed by cardiologist Dr. Achara after switching from Dr. Epifanio due to dissatisfaction with prior management of leg swelling    ROS See HPI.    Objective:     BP 135/83   Pulse 83   Ht 5' 10 (1.778 m)   Wt 222 lb (100.7 kg)   SpO2 99%   BMI 31.85 kg/m  BP Readings from Last 3 Encounters:  07/01/24 135/83  06/23/24 134/78  12/23/23 104/60   Wt Readings from Last 3  Encounters:  07/01/24 222 lb (100.7 kg)  06/23/24 222 lb (100.7 kg)  12/23/23 219 lb (99.3 kg)      Physical Exam Constitutional:      Appearance: Normal appearance. He is obese.  HENT:     Head: Normocephalic.  Cardiovascular:     Rate and Rhythm: Normal rate and regular rhythm.  Pulmonary:     Effort: Pulmonary effort is normal.     Breath sounds: Normal breath sounds.  Musculoskeletal:     Right lower leg: No edema.     Left lower leg: No edema.     Comments: Superficial varicose veins of bilateral legs.   Neurological:     General: No focal deficit present.     Mental Status: He is alert and oriented to person, place, and time.  Psychiatric:        Mood and Affect: Mood normal.      Results for orders placed or performed in visit on 07/01/24  POCT HgB A1C  Result Value Ref Range   Hemoglobin A1C 7.0 (A) 4.0 - 5.6 %   HbA1c POC (<> result, manual entry)     HbA1c, POC (prediabetic range)     HbA1c, POC (controlled diabetic range)    POCT UA - Microalbumin  Result Value Ref Range   Microalbumin Ur, POC 30 mg/L   Creatinine, POC 100 mg/dL   Albumin/Creatinine Ratio, Urine, POC <30       Assessment & Plan:  .William Ortega was seen today for medical management of chronic issues.  Diagnoses and all orders for this  visit:  Type 2 diabetes mellitus with diabetic neuropathy, without long-term current use of insulin (HCC) -     POCT HgB A1C -     POCT UA - Microalbumin -     Lipid panel -     CMP14+EGFR -     tirzepatide  (MOUNJARO ) 2.5 MG/0.5ML Pen; Inject 2.5 mg into the skin once a week. -     tirzepatide  (MOUNJARO ) 5 MG/0.5ML Pen; Inject 5 mg into the skin once a week. -     metFORMIN  (GLUCOPHAGE ) 500 MG tablet; Take 1 tablet (500 mg total) by mouth 2 (two) times daily with a meal.  Immunization due -     Flu vaccine trivalent PF, 6mos and older(Flulaval,Afluria,Fluarix,Fluzone) -     Pneumococcal conjugate vaccine 20-valent (Prevnar 20)  Pure  hypercholesterolemia -     Lipid panel  Hypogonadism male -     PSA, total and free  Paroxysmal atrial fibrillation (HCC)  Preventative health care -     Lipid panel -     CMP14+EGFR -     CBC w/Diff/Platelet -     PSA, total and free -     TSH + free T4   Assessment & Plan Type 2 diabetes mellitus with diabetic neuropathy Suboptimal glycemic control with A1c improved from 7.3 to 7.0. Mounjaro  preferred for glycemic control, weight loss, and cardiovascular benefits. - Started Mounjaro  2.5 mg for four weeks, then 5 mg weekly. - continue metformin  - UA with no mircoalbumin detected - Ordered fasting labs: cholesterol, thyroid , PSA, kidney, liver function tests. - Scheduled follow-up in three months for A1c evaluation. - Will schedule eye exam  Paroxysmal atrial fibrillation Managed with flecainide  and metoprolol , no recent symptoms reported. - Continue to follow up with cardiology.   Chronic venous insufficiency with lower extremity edema Symptoms managed with compression stockings. - Continue compression stockings. - Advised leg elevation and avoiding prolonged standing.  General Health Maintenance Routine health maintenance discussed. - Ordered fasting labs for comprehensive health screening. - Flu and Pneumonia vaccine given today     Return in about 3 months (around 09/29/2024).    Lashonta Pilling, PA-C  "

## 2024-07-02 LAB — TSH+FREE T4
Free T4: 1.31 ng/dL (ref 0.82–1.77)
TSH: 3.29 u[IU]/mL (ref 0.450–4.500)

## 2024-07-02 LAB — CMP14+EGFR
ALT: 49 [IU]/L — ABNORMAL HIGH (ref 0–44)
AST: 29 [IU]/L (ref 0–40)
Albumin: 4.4 g/dL (ref 3.9–4.9)
Alkaline Phosphatase: 76 [IU]/L (ref 47–123)
BUN/Creatinine Ratio: 19 (ref 10–24)
BUN: 19 mg/dL (ref 8–27)
Bilirubin Total: 0.9 mg/dL (ref 0.0–1.2)
CO2: 22 mmol/L (ref 20–29)
Calcium: 9.3 mg/dL (ref 8.6–10.2)
Chloride: 101 mmol/L (ref 96–106)
Creatinine, Ser: 0.99 mg/dL (ref 0.76–1.27)
Globulin, Total: 1.9 g/dL (ref 1.5–4.5)
Glucose: 197 mg/dL — ABNORMAL HIGH (ref 70–99)
Potassium: 4.5 mmol/L (ref 3.5–5.2)
Sodium: 138 mmol/L (ref 134–144)
Total Protein: 6.3 g/dL (ref 6.0–8.5)
eGFR: 87 mL/min/{1.73_m2}

## 2024-07-02 LAB — CBC WITH DIFFERENTIAL/PLATELET
Basophils Absolute: 0.1 10*3/uL (ref 0.0–0.2)
Basos: 1 %
EOS (ABSOLUTE): 0.2 10*3/uL (ref 0.0–0.4)
Eos: 5 %
Hematocrit: 38.2 % (ref 37.5–51.0)
Hemoglobin: 12.9 g/dL — ABNORMAL LOW (ref 13.0–17.7)
Immature Grans (Abs): 0 10*3/uL (ref 0.0–0.1)
Immature Granulocytes: 0 %
Lymphocytes Absolute: 0.8 10*3/uL (ref 0.7–3.1)
Lymphs: 21 %
MCH: 32.8 pg (ref 26.6–33.0)
MCHC: 33.8 g/dL (ref 31.5–35.7)
MCV: 97 fL (ref 79–97)
Monocytes Absolute: 0.3 10*3/uL (ref 0.1–0.9)
Monocytes: 9 %
Neutrophils Absolute: 2.4 10*3/uL (ref 1.4–7.0)
Neutrophils: 64 %
Platelets: 122 10*3/uL — ABNORMAL LOW (ref 150–450)
RBC: 3.93 x10E6/uL — ABNORMAL LOW (ref 4.14–5.80)
RDW: 12.2 % (ref 11.6–15.4)
WBC: 3.8 10*3/uL (ref 3.4–10.8)

## 2024-07-02 LAB — PSA, TOTAL AND FREE
PSA, Free Pct: 19.4 %
PSA, Free: 0.31 ng/mL
Prostate Specific Ag, Serum: 1.6 ng/mL (ref 0.0–4.0)

## 2024-07-02 LAB — LIPID PANEL
Chol/HDL Ratio: 4.3 ratio (ref 0.0–5.0)
Cholesterol, Total: 133 mg/dL (ref 100–199)
HDL: 31 mg/dL — ABNORMAL LOW
LDL Chol Calc (NIH): 56 mg/dL (ref 0–99)
Triglycerides: 291 mg/dL — ABNORMAL HIGH (ref 0–149)
VLDL Cholesterol Cal: 46 mg/dL — ABNORMAL HIGH (ref 5–40)

## 2024-07-04 ENCOUNTER — Ambulatory Visit: Admitting: Physician Assistant

## 2024-07-04 ENCOUNTER — Ambulatory Visit: Payer: Self-pay | Admitting: Physician Assistant

## 2024-07-04 NOTE — Progress Notes (Signed)
 William Ortega,   Thyroid  looks good.  PSA normal.  LDL to goal.  TG elevated. Hopefully re-starting mounjaro  will help lower sugars and TG.

## 2024-09-30 ENCOUNTER — Ambulatory Visit: Admitting: Physician Assistant

## 2024-12-21 ENCOUNTER — Ambulatory Visit: Admitting: Urology
